# Patient Record
Sex: Female | Born: 1960
Health system: Southern US, Community
[De-identification: ages and names within clinical notes are randomized; demographics above are authoritative.]

## PROBLEM LIST (undated history)

## (undated) DIAGNOSIS — F32A Depression, unspecified: Secondary | ICD-10-CM

## (undated) DIAGNOSIS — M549 Dorsalgia, unspecified: Secondary | ICD-10-CM

## (undated) DIAGNOSIS — F329 Major depressive disorder, single episode, unspecified: Secondary | ICD-10-CM

## (undated) DIAGNOSIS — K219 Gastro-esophageal reflux disease without esophagitis: Secondary | ICD-10-CM

## (undated) DIAGNOSIS — J449 Chronic obstructive pulmonary disease, unspecified: Secondary | ICD-10-CM

## (undated) HISTORY — PX: LYMPH NODE BIOPSY: SHX201

## (undated) HISTORY — PX: SPINE SURGERY: SHX786

## (undated) HISTORY — DX: Dorsalgia, unspecified: M54.9

## (undated) HISTORY — DX: Chronic obstructive pulmonary disease, unspecified: J44.9

## (undated) HISTORY — DX: Major depressive disorder, single episode, unspecified: F32.9

## (undated) HISTORY — PX: TONSILLECTOMY: SUR1361

## (undated) HISTORY — DX: Gastro-esophageal reflux disease without esophagitis: K21.9

## (undated) HISTORY — DX: Depression, unspecified: F32.A

---

## 2000-08-19 HISTORY — PX: CHEST TUBE INSERTION: SHX231

## 2000-08-19 HISTORY — PX: LUNG SURGERY: SHX703

## 2011-12-05 DIAGNOSIS — F329 Major depressive disorder, single episode, unspecified: Secondary | ICD-10-CM | POA: Diagnosis not present

## 2011-12-05 DIAGNOSIS — R351 Nocturia: Secondary | ICD-10-CM | POA: Diagnosis not present

## 2011-12-05 DIAGNOSIS — F3289 Other specified depressive episodes: Secondary | ICD-10-CM | POA: Diagnosis not present

## 2011-12-05 DIAGNOSIS — Z79899 Other long term (current) drug therapy: Secondary | ICD-10-CM | POA: Diagnosis not present

## 2011-12-05 DIAGNOSIS — J441 Chronic obstructive pulmonary disease with (acute) exacerbation: Secondary | ICD-10-CM | POA: Diagnosis not present

## 2011-12-05 DIAGNOSIS — J329 Chronic sinusitis, unspecified: Secondary | ICD-10-CM | POA: Diagnosis not present

## 2011-12-05 DIAGNOSIS — M549 Dorsalgia, unspecified: Secondary | ICD-10-CM | POA: Diagnosis not present

## 2011-12-07 DIAGNOSIS — J449 Chronic obstructive pulmonary disease, unspecified: Secondary | ICD-10-CM | POA: Diagnosis not present

## 2011-12-22 DIAGNOSIS — G4733 Obstructive sleep apnea (adult) (pediatric): Secondary | ICD-10-CM | POA: Diagnosis not present

## 2012-03-19 DIAGNOSIS — M79609 Pain in unspecified limb: Secondary | ICD-10-CM | POA: Diagnosis not present

## 2012-03-19 DIAGNOSIS — G8929 Other chronic pain: Secondary | ICD-10-CM | POA: Diagnosis not present

## 2012-03-19 DIAGNOSIS — G894 Chronic pain syndrome: Secondary | ICD-10-CM | POA: Diagnosis not present

## 2012-03-19 DIAGNOSIS — J449 Chronic obstructive pulmonary disease, unspecified: Secondary | ICD-10-CM | POA: Diagnosis not present

## 2012-03-19 DIAGNOSIS — F3289 Other specified depressive episodes: Secondary | ICD-10-CM | POA: Diagnosis not present

## 2012-03-19 DIAGNOSIS — Z79899 Other long term (current) drug therapy: Secondary | ICD-10-CM | POA: Diagnosis not present

## 2012-03-19 DIAGNOSIS — J4489 Other specified chronic obstructive pulmonary disease: Secondary | ICD-10-CM | POA: Diagnosis not present

## 2012-03-19 DIAGNOSIS — F329 Major depressive disorder, single episode, unspecified: Secondary | ICD-10-CM | POA: Diagnosis not present

## 2012-03-24 DIAGNOSIS — J449 Chronic obstructive pulmonary disease, unspecified: Secondary | ICD-10-CM | POA: Diagnosis not present

## 2012-09-08 DIAGNOSIS — J449 Chronic obstructive pulmonary disease, unspecified: Secondary | ICD-10-CM | POA: Diagnosis not present

## 2012-09-08 DIAGNOSIS — K219 Gastro-esophageal reflux disease without esophagitis: Secondary | ICD-10-CM | POA: Diagnosis not present

## 2012-09-08 DIAGNOSIS — F329 Major depressive disorder, single episode, unspecified: Secondary | ICD-10-CM | POA: Diagnosis not present

## 2012-09-08 DIAGNOSIS — M549 Dorsalgia, unspecified: Secondary | ICD-10-CM | POA: Diagnosis not present

## 2012-10-15 DIAGNOSIS — F17200 Nicotine dependence, unspecified, uncomplicated: Secondary | ICD-10-CM | POA: Diagnosis not present

## 2012-10-15 DIAGNOSIS — J309 Allergic rhinitis, unspecified: Secondary | ICD-10-CM | POA: Diagnosis not present

## 2012-10-15 DIAGNOSIS — J449 Chronic obstructive pulmonary disease, unspecified: Secondary | ICD-10-CM | POA: Diagnosis not present

## 2012-10-15 DIAGNOSIS — Z760 Encounter for issue of repeat prescription: Secondary | ICD-10-CM | POA: Diagnosis not present

## 2012-11-21 DIAGNOSIS — Z79899 Other long term (current) drug therapy: Secondary | ICD-10-CM | POA: Diagnosis not present

## 2012-11-21 DIAGNOSIS — R509 Fever, unspecified: Secondary | ICD-10-CM | POA: Diagnosis not present

## 2012-11-21 DIAGNOSIS — K644 Residual hemorrhoidal skin tags: Secondary | ICD-10-CM | POA: Diagnosis not present

## 2012-11-21 DIAGNOSIS — K5289 Other specified noninfective gastroenteritis and colitis: Secondary | ICD-10-CM | POA: Diagnosis not present

## 2012-11-21 DIAGNOSIS — K648 Other hemorrhoids: Secondary | ICD-10-CM | POA: Diagnosis not present

## 2012-11-21 DIAGNOSIS — R109 Unspecified abdominal pain: Secondary | ICD-10-CM | POA: Diagnosis not present

## 2012-11-21 DIAGNOSIS — R1032 Left lower quadrant pain: Secondary | ICD-10-CM | POA: Diagnosis not present

## 2012-11-21 DIAGNOSIS — E860 Dehydration: Secondary | ICD-10-CM | POA: Diagnosis not present

## 2012-11-26 DIAGNOSIS — F17200 Nicotine dependence, unspecified, uncomplicated: Secondary | ICD-10-CM | POA: Diagnosis not present

## 2012-11-26 DIAGNOSIS — K5289 Other specified noninfective gastroenteritis and colitis: Secondary | ICD-10-CM | POA: Diagnosis not present

## 2012-11-26 DIAGNOSIS — Z1331 Encounter for screening for depression: Secondary | ICD-10-CM | POA: Diagnosis not present

## 2013-01-27 DIAGNOSIS — F17200 Nicotine dependence, unspecified, uncomplicated: Secondary | ICD-10-CM | POA: Diagnosis not present

## 2013-01-27 DIAGNOSIS — K140 Glossitis: Secondary | ICD-10-CM | POA: Diagnosis not present

## 2013-01-27 DIAGNOSIS — G8929 Other chronic pain: Secondary | ICD-10-CM | POA: Diagnosis not present

## 2013-01-27 DIAGNOSIS — M549 Dorsalgia, unspecified: Secondary | ICD-10-CM | POA: Diagnosis not present

## 2013-03-02 DIAGNOSIS — IMO0001 Reserved for inherently not codable concepts without codable children: Secondary | ICD-10-CM | POA: Diagnosis not present

## 2013-03-02 DIAGNOSIS — M5137 Other intervertebral disc degeneration, lumbosacral region: Secondary | ICD-10-CM | POA: Diagnosis not present

## 2013-03-02 DIAGNOSIS — IMO0002 Reserved for concepts with insufficient information to code with codable children: Secondary | ICD-10-CM | POA: Diagnosis not present

## 2013-03-02 DIAGNOSIS — M961 Postlaminectomy syndrome, not elsewhere classified: Secondary | ICD-10-CM | POA: Diagnosis not present

## 2013-03-04 DIAGNOSIS — M5137 Other intervertebral disc degeneration, lumbosacral region: Secondary | ICD-10-CM | POA: Diagnosis not present

## 2013-03-04 DIAGNOSIS — Z79899 Other long term (current) drug therapy: Secondary | ICD-10-CM | POA: Diagnosis not present

## 2013-03-16 DIAGNOSIS — M5137 Other intervertebral disc degeneration, lumbosacral region: Secondary | ICD-10-CM | POA: Diagnosis not present

## 2013-03-16 DIAGNOSIS — IMO0002 Reserved for concepts with insufficient information to code with codable children: Secondary | ICD-10-CM | POA: Diagnosis not present

## 2013-03-16 DIAGNOSIS — T887XXA Unspecified adverse effect of drug or medicament, initial encounter: Secondary | ICD-10-CM | POA: Diagnosis not present

## 2013-03-16 DIAGNOSIS — M961 Postlaminectomy syndrome, not elsewhere classified: Secondary | ICD-10-CM | POA: Diagnosis not present

## 2013-03-16 DIAGNOSIS — IMO0001 Reserved for inherently not codable concepts without codable children: Secondary | ICD-10-CM | POA: Diagnosis not present

## 2013-04-14 DIAGNOSIS — G8929 Other chronic pain: Secondary | ICD-10-CM | POA: Diagnosis not present

## 2013-04-14 DIAGNOSIS — M545 Low back pain: Secondary | ICD-10-CM | POA: Diagnosis not present

## 2013-05-12 DIAGNOSIS — M5137 Other intervertebral disc degeneration, lumbosacral region: Secondary | ICD-10-CM | POA: Diagnosis not present

## 2013-05-12 DIAGNOSIS — G8929 Other chronic pain: Secondary | ICD-10-CM | POA: Diagnosis not present

## 2013-05-12 DIAGNOSIS — M961 Postlaminectomy syndrome, not elsewhere classified: Secondary | ICD-10-CM | POA: Diagnosis not present

## 2013-06-10 DIAGNOSIS — M51379 Other intervertebral disc degeneration, lumbosacral region without mention of lumbar back pain or lower extremity pain: Secondary | ICD-10-CM | POA: Diagnosis not present

## 2013-06-10 DIAGNOSIS — M5137 Other intervertebral disc degeneration, lumbosacral region: Secondary | ICD-10-CM | POA: Diagnosis not present

## 2013-06-10 DIAGNOSIS — Z79899 Other long term (current) drug therapy: Secondary | ICD-10-CM | POA: Diagnosis not present

## 2013-07-05 DIAGNOSIS — Z79899 Other long term (current) drug therapy: Secondary | ICD-10-CM | POA: Diagnosis not present

## 2013-08-05 DIAGNOSIS — M5137 Other intervertebral disc degeneration, lumbosacral region: Secondary | ICD-10-CM | POA: Diagnosis not present

## 2013-08-24 DIAGNOSIS — F17200 Nicotine dependence, unspecified, uncomplicated: Secondary | ICD-10-CM | POA: Diagnosis not present

## 2013-08-24 DIAGNOSIS — J441 Chronic obstructive pulmonary disease with (acute) exacerbation: Secondary | ICD-10-CM | POA: Diagnosis not present

## 2013-08-24 DIAGNOSIS — Z9981 Dependence on supplemental oxygen: Secondary | ICD-10-CM | POA: Diagnosis not present

## 2013-08-24 DIAGNOSIS — J40 Bronchitis, not specified as acute or chronic: Secondary | ICD-10-CM | POA: Diagnosis not present

## 2013-08-24 DIAGNOSIS — I517 Cardiomegaly: Secondary | ICD-10-CM | POA: Diagnosis not present

## 2013-09-20 ENCOUNTER — Ambulatory Visit (INDEPENDENT_AMBULATORY_CARE_PROVIDER_SITE_OTHER): Payer: Medicare Other | Admitting: Family Medicine

## 2013-09-20 ENCOUNTER — Encounter: Payer: Self-pay | Admitting: Family Medicine

## 2013-09-20 ENCOUNTER — Encounter (INDEPENDENT_AMBULATORY_CARE_PROVIDER_SITE_OTHER): Payer: Self-pay

## 2013-09-20 VITALS — BP 103/65 | HR 90 | Temp 97.4°F

## 2013-09-20 DIAGNOSIS — F329 Major depressive disorder, single episode, unspecified: Secondary | ICD-10-CM | POA: Diagnosis not present

## 2013-09-20 DIAGNOSIS — G894 Chronic pain syndrome: Secondary | ICD-10-CM | POA: Diagnosis not present

## 2013-09-20 DIAGNOSIS — F3289 Other specified depressive episodes: Secondary | ICD-10-CM

## 2013-09-20 DIAGNOSIS — J449 Chronic obstructive pulmonary disease, unspecified: Secondary | ICD-10-CM

## 2013-09-20 DIAGNOSIS — K219 Gastro-esophageal reflux disease without esophagitis: Secondary | ICD-10-CM

## 2013-09-20 DIAGNOSIS — F32A Depression, unspecified: Secondary | ICD-10-CM

## 2013-09-20 MED ORDER — TIOTROPIUM BROMIDE MONOHYDRATE 18 MCG IN CAPS: 18.0000 ug | ORAL_CAPSULE | Freq: Every day | RESPIRATORY_TRACT | Status: DC

## 2013-09-20 MED ORDER — CITALOPRAM HYDROBROMIDE 40 MG PO TABS: 40.0000 mg | ORAL_TABLET | Freq: Every day | ORAL | Status: DC

## 2013-09-20 MED ORDER — OMEPRAZOLE 20 MG PO CPDR: 20.0000 mg | DELAYED_RELEASE_CAPSULE | Freq: Every day | ORAL | Status: DC

## 2013-09-20 MED ORDER — CETIRIZINE HCL 10 MG PO TABS: 10.0000 mg | ORAL_TABLET | Freq: Every day | ORAL | Status: DC

## 2013-09-20 NOTE — Progress Notes (Signed)
   Subjective:    Patient ID: Shanan Fitzpatrick, female    DOB: Sep 11, 1960, 53 y.o.   MRN: [id number]  HPI This 53 y.o. female presents for evaluation of new patient visit.  She is in wheelchair and C/o recent fall and severe back pain and weakness in her lower extremities.  She is in Wheelchair and states she is in so severe pain she could not walk into the clinic.  She Was seeing a pain doctor in 12/14 for her chronic pain and she quit  Going.  According to  Her CSR she was on oxycodone 5mg , fentynl patch, and ultram for pain which she states she Has been out of since she moved to be with family in [place].  She is accompanied by a family Member who states she cannot afford to take her to her pain doctor in [place].  She was seeing A doctor Rosann Auerbach.  She has hx of COPD and GERD.   Review of Systems C/o severe back pain No chest pain, SOB, HA, dizziness, vision change, N/V, diarrhea, constipation, dysuria, urinary urgency or frequency, myalgias, arthralgias or rash.     Objective:   Physical Exam  Vital signs noted  Chronically ill cachetic appearing female who appears older that stated age.  HEENT - Head atraumatic Normocephalic                Eyes - PERRLA, Conjuctiva - clear Sclera- Clear EOMI                Ears - EAC's Wnl TM's Wnl Gross Hearing WNL                Throat - oropharanx wnl Respiratory - Lungs CTA and diminished Cardiac - RRR S1 and S2 without murmur GI - Abdomen soft Nontender and bowel sounds active x 4 Extremities - weak lower extremities Neuro - Unstable gait, and unable to walk       Assessment & Plan:  Chronic pain syndrome - Plan: Ambulatory referral to Pain Clinic,  Explained that I cannot do pain Management and do not feel comfortable in tx her back pain and in fact would recommend she go to the ED for xrays and pain meds to get her severe pain under control.  She wants a pain provider Closer to where she is living in [place] and doesn't  want to see Dr. Rosann Auerbach anymore for her pain.  COPD (chronic obstructive pulmonary disease) - Plan: tiotropium (SPIRIVA) 18 MCG inhalation capsule, cetirizine (ZYRTEC) 10 MG tablet  GERD (gastroesophageal reflux disease) - Plan: omeprazole (PRILOSEC) 20 MG capsule  Depression - Plan: citalopram (CELEXA) 40 MG tablet  Lysbeth Penner FNP

## 2013-10-11 ENCOUNTER — Telehealth: Payer: Self-pay | Admitting: Family Medicine

## 2013-12-20 ENCOUNTER — Ambulatory Visit (INDEPENDENT_AMBULATORY_CARE_PROVIDER_SITE_OTHER): Payer: Medicare Other

## 2013-12-20 ENCOUNTER — Encounter: Payer: Self-pay | Admitting: Family Medicine

## 2013-12-20 ENCOUNTER — Ambulatory Visit (INDEPENDENT_AMBULATORY_CARE_PROVIDER_SITE_OTHER): Payer: Medicare Other | Admitting: Family Medicine

## 2013-12-20 VITALS — BP 101/68 | HR 86 | Temp 98.6°F | Ht 62.0 in | Wt 120.0 lb

## 2013-12-20 DIAGNOSIS — R079 Chest pain, unspecified: Secondary | ICD-10-CM

## 2013-12-20 DIAGNOSIS — Z139 Encounter for screening, unspecified: Secondary | ICD-10-CM | POA: Diagnosis not present

## 2013-12-20 DIAGNOSIS — R5381 Other malaise: Secondary | ICD-10-CM

## 2013-12-20 DIAGNOSIS — R0602 Shortness of breath: Secondary | ICD-10-CM

## 2013-12-20 DIAGNOSIS — J209 Acute bronchitis, unspecified: Secondary | ICD-10-CM

## 2013-12-20 DIAGNOSIS — F17200 Nicotine dependence, unspecified, uncomplicated: Secondary | ICD-10-CM

## 2013-12-20 DIAGNOSIS — M6740 Ganglion, unspecified site: Secondary | ICD-10-CM

## 2013-12-20 DIAGNOSIS — Z720 Tobacco use: Secondary | ICD-10-CM

## 2013-12-20 DIAGNOSIS — R5383 Other fatigue: Secondary | ICD-10-CM | POA: Diagnosis not present

## 2013-12-20 DIAGNOSIS — M549 Dorsalgia, unspecified: Secondary | ICD-10-CM

## 2013-12-20 LAB — POCT CBC
Granulocyte percent: 77.7 %G (ref 37–80)
HCT, POC: 42.6 % (ref 37.7–47.9)
Hemoglobin: 13.2 g/dL (ref 12.2–16.2)
Lymph, poc: 2 (ref 0.6–3.4)
MCH, POC: 28 pg (ref 27–31.2)
MCHC: 30.9 g/dL — AB (ref 31.8–35.4)
MCV: 90.5 fL (ref 80–97)
MPV: 7.7 fL (ref 0–99.8)
POC Granulocyte: 8.1 — AB (ref 2–6.9)
POC LYMPH PERCENT: 19.7 %L (ref 10–50)
Platelet Count, POC: 344 10*3/uL (ref 142–424)
RBC: 4.7 M/uL (ref 4.04–5.48)
RDW, POC: 17.6 %
WBC: 10.4 10*3/uL — AB (ref 4.6–10.2)

## 2013-12-20 MED ORDER — AMOXICILLIN 875 MG PO TABS: 875.0000 mg | ORAL_TABLET | Freq: Two times a day (BID) | ORAL | Status: DC

## 2013-12-20 MED ORDER — METHYLPREDNISOLONE (PAK) 4 MG PO TABS: ORAL_TABLET | ORAL | Status: DC

## 2013-12-20 MED ORDER — TRAMADOL HCL 50 MG PO TABS: 100.0000 mg | ORAL_TABLET | Freq: Four times a day (QID) | ORAL | Status: DC | PRN

## 2013-12-20 NOTE — Progress Notes (Signed)
   Subjective:    Patient ID: Kristy Moore, female    DOB: [birth year], 53 y.o.   MRN: [id number]  HPI  This 53 y.o. female presents for evaluation of back pain, chest pain, and right wrist pain, and her sister Accompanies her and states she cannot walk very well.  She is living with her sister.  She had a referral to pain but she states it didn't get done.  She is not able to care for herself according to her sister.  She states she has been in so much pain she has been on the floor and her sister found her and she could not walk.  She has a mass in her right wrist that is causing pain.  She has hx of DDD of the LS spine and has had surgery.  She was seeing pain management in [place].  She moved down to live with her sister in 2/15.  She has a mass in her right wrist.  She has been a smoker for a long time.  She is coughing and gets SOB.  Review of Systems C/o back pain, SOB, and right wrist mass.   No  HA, dizziness, vision change, N/V, diarrhea, constipation, dysuria, urinary urgency or frequency, myalgias, arthralgias or rash.  Objective:   Physical Exam  Vital signs noted  Chronically ill appearing female in NAD.  HEENT - Head atraumatic Normocephalic                Eyes - PERRLA, Conjuctiva - clear Sclera- Clear EOMI                Ears - EAC's Wnl TM's Wnl Gross Hearing WNL                Throat - oropharanx wnl Respiratory - Lungs CTA bilateral Cardiac - RRR S1 and S2 without murmur GI - Abdomen soft Nontender and bowel sounds active x 4 Extremities - No edema. Neuro - Grossly intact. MS - right wrist with ganglion cyst,  TTP LS spine    EKG NSR with early repolarization and no acute st-t changes CXR - Hyperinflation no infiltrates Prelimnary reading by Iverson Alamin Assessment & Plan:  Chest pain - Plan: EKG 12-Lead, DG Chest 2 View, Ambulatory referral to Cardiology  SOB (shortness of breath) - Plan: EKG 12-Lead, DG Chest 2 View, POCT CBC, CMP14+EGFR  Fatigue -  Plan: Thyroid Panel With TSH  Ganglion cyst - Plan: Ambulatory referral to Orthopedic Surgery  Acute bronchitis - Plan: amoxicillin (AMOXIL) 875 MG tablet, methylPREDNIsolone (MEDROL DOSPACK) 4 MG tablet  Tobacco abuse - discussed at length the need to quit smoking  Bronchitis - Amoxicillin 866m one po bid x 10 days #20, Medrol dose pack as directed.  DDD and Back pain - Walker rx'd, referral to pain management checked and is still pending. Tramadol 528m2 po tid prn #90  Follow up in 2 weeks  WiLysbeth PennerNP

## 2013-12-21 ENCOUNTER — Other Ambulatory Visit: Payer: Self-pay | Admitting: Family Medicine

## 2013-12-21 LAB — CMP14+EGFR
ALT: 15 IU/L (ref 0–32)
AST: 25 IU/L (ref 0–40)
Albumin/Globulin Ratio: 2.2 (ref 1.1–2.5)
Albumin: 4.3 g/dL (ref 3.5–5.5)
Alkaline Phosphatase: 64 IU/L (ref 39–117)
BUN/Creatinine Ratio: 25 — ABNORMAL HIGH (ref 9–23)
BUN: 15 mg/dL (ref 6–24)
CO2: 21 mmol/L (ref 18–29)
Calcium: 9.3 mg/dL (ref 8.7–10.2)
Chloride: 102 mmol/L (ref 97–108)
Creatinine, Ser: 0.59 mg/dL (ref 0.57–1.00)
GFR calc Af Amer: 120 mL/min/{1.73_m2} (ref >59)
GFR calc non Af Amer: 104 mL/min/{1.73_m2} (ref >59)
Globulin, Total: 2 g/dL (ref 1.5–4.5)
Glucose: 66 mg/dL (ref 65–99)
Potassium: 4.4 mmol/L (ref 3.5–5.2)
Sodium: 142 mmol/L (ref 134–144)
Total Bilirubin: <0.2 mg/dL (ref 0.0–1.2)
Total Protein: 6.3 g/dL (ref 6.0–8.5)

## 2013-12-21 LAB — THYROID PANEL WITH TSH
Free Thyroxine Index: 1.4 (ref 1.2–4.9)
T3 Uptake Ratio: 26 % (ref 24–39)
T4, Total: 5.4 ug/dL (ref 4.5–12.0)
TSH: 0.445 u[IU]/mL — ABNORMAL LOW (ref 0.450–4.500)

## 2013-12-21 MED ORDER — LEVOFLOXACIN 500 MG PO TABS: 500.0000 mg | ORAL_TABLET | Freq: Every day | ORAL | Status: DC

## 2013-12-23 ENCOUNTER — Other Ambulatory Visit: Payer: Self-pay | Admitting: Family Medicine

## 2013-12-27 ENCOUNTER — Telehealth: Payer: Self-pay | Admitting: Family Medicine

## 2013-12-27 NOTE — Telephone Encounter (Signed)
Pt notified of xray results  Also informed Rx was sent into [facility] Will follow up

## 2013-12-27 NOTE — Telephone Encounter (Signed)
Message copied by Waverly Ferrari on Mon Dec 27, 2013 11:13 AM ------      Message from: Lysbeth Penner      Created: Thu Dec 23, 2013 11:50 AM       WBC elevated due to pneumonia, TSH low and would follow this and repeat thyroid panel in next few months ------

## 2014-01-06 ENCOUNTER — Ambulatory Visit (INDEPENDENT_AMBULATORY_CARE_PROVIDER_SITE_OTHER): Payer: Medicare Other | Admitting: Family Medicine

## 2014-01-06 ENCOUNTER — Encounter: Payer: Self-pay | Admitting: Family Medicine

## 2014-01-06 ENCOUNTER — Ambulatory Visit (INDEPENDENT_AMBULATORY_CARE_PROVIDER_SITE_OTHER): Payer: Medicare Other

## 2014-01-06 VITALS — BP 123/77 | HR 94 | Temp 98.2°F | Ht 62.0 in | Wt 118.0 lb

## 2014-01-06 DIAGNOSIS — J449 Chronic obstructive pulmonary disease, unspecified: Secondary | ICD-10-CM | POA: Diagnosis not present

## 2014-01-06 DIAGNOSIS — M549 Dorsalgia, unspecified: Secondary | ICD-10-CM | POA: Diagnosis not present

## 2014-01-06 DIAGNOSIS — J189 Pneumonia, unspecified organism: Secondary | ICD-10-CM

## 2014-01-06 DIAGNOSIS — R7989 Other specified abnormal findings of blood chemistry: Secondary | ICD-10-CM

## 2014-01-06 DIAGNOSIS — R946 Abnormal results of thyroid function studies: Secondary | ICD-10-CM | POA: Diagnosis not present

## 2014-01-06 LAB — POCT CBC
Granulocyte percent: 65.3 %G (ref 37–80)
HCT, POC: 44.3 % (ref 37.7–47.9)
Hemoglobin: 13.8 g/dL (ref 12.2–16.2)
Lymph, poc: 2.4 (ref 0.6–3.4)
MCH, POC: 28.1 pg (ref 27–31.2)
MCHC: 31.2 g/dL — AB (ref 31.8–35.4)
MCV: 90.1 fL (ref 80–97)
MPV: 7.3 fL (ref 0–99.8)
POC Granulocyte: 5.3 (ref 2–6.9)
POC LYMPH PERCENT: 29.8 %L (ref 10–50)
Platelet Count, POC: 418 10*3/uL (ref 142–424)
RBC: 4.9 M/uL (ref 4.04–5.48)
RDW, POC: 16.5 %
WBC: 8.1 10*3/uL (ref 4.6–10.2)

## 2014-01-06 MED ORDER — IPRATROPIUM BROMIDE 0.02 % IN SOLN: 0.5000 mg | Freq: Four times a day (QID) | RESPIRATORY_TRACT | Status: DC

## 2014-01-06 MED ORDER — METHYLPREDNISOLONE ACETATE 80 MG/ML IJ SUSP
80.0000 mg | Freq: Once | INTRAMUSCULAR | Status: AC
  Administered 2014-01-06: 80 mg via INTRAMUSCULAR

## 2014-01-06 MED ORDER — PREDNISONE 10 MG PO TABS: ORAL_TABLET | ORAL | Status: DC

## 2014-01-06 MED ORDER — ALBUTEROL SULFATE (2.5 MG/3ML) 0.083% IN NEBU: INHALATION_SOLUTION | RESPIRATORY_TRACT | Status: DC

## 2014-01-06 MED ORDER — TRAMADOL HCL 50 MG PO TABS: 100.0000 mg | ORAL_TABLET | Freq: Four times a day (QID) | ORAL | Status: DC | PRN

## 2014-01-06 MED ORDER — LEVALBUTEROL HCL 1.25 MG/0.5ML IN NEBU
1.2500 mg | INHALATION_SOLUTION | Freq: Once | RESPIRATORY_TRACT | Status: AC
  Administered 2014-01-06: 1.25 mg via RESPIRATORY_TRACT

## 2014-01-06 NOTE — Progress Notes (Signed)
   Subjective:    Patient ID: Canesha Tesfaye, female    DOB: 06-01-1961, 53 y.o.   MRN: [id number]  HPI This 53 y.o. female presents for evaluation of follow up on pneumonia.  She has right middle lobe pneumonia and has been on levaquin.   She feels sob with exertion and she does still have some Fever. She states she does feel better.  She is not in as much pain and she is able to walk better after taking the tramadol.   Review of Systems C/o SOB, cough, uri sx's   No chest pain, SOB, HA, dizziness, vision change, N/V, diarrhea, constipation, dysuria, urinary urgency or frequency, myalgias, arthralgias or rash.  Objective:   Physical Exam  Vital signs noted  Chronically ill appearing female in NAD.  HEENT - Head atraumatic Normocephalic                Eyes - PERRLA, Conjuctiva - clear Sclera- Clear EOMI                Ears - EAC's Wnl TM's Wnl Gross Hearing WNL                Throat - oropharanx wnl Respiratory - Lungs expiratory wheezes scattered Cardiac - RRR S1 and S2 without murmur GI - Abdomen soft Nontender and bowel sounds active x 4 Extremities - No edema. Neuro - Grossly intact.  Results for orders placed in visit on 01/06/14  POCT CBC      Result Value Ref Range   WBC 8.1  4.6 - 10.2 K/uL   Lymph, poc 2.4  0.6 - 3.4   POC LYMPH PERCENT 29.8  10 - 50 %L   POC Granulocyte 5.3  2 - 6.9   Granulocyte percent 65.3  37 - 80 %G   RBC 4.9  4.04 - 5.48 M/uL   Hemoglobin 13.8  12.2 - 16.2 g/dL   HCT, POC 44.3  37.7 - 47.9 %   MCV 90.1  80 - 97 fL   MCH, POC 28.1  27 - 31.2 pg   MCHC 31.2 (*) 31.8 - 35.4 g/dL   RDW, POC 16.5     Platelet Count, POC 418.0  142 - 424 K/uL   MPV 7.3  0 - 99.8 fL     CXR - Pneumonia resolving right chest Prelimnary reading by Gwyndolyn Saxon Cecilio Ohlrich,FNP Assessment & Plan:  CAP (community acquired pneumonia) - Plan: POCT CBC, BMP8+EGFR, DG Chest 2 View, levalbuterol (XOPENEX) nebulizer solution 1.25 mg, ipratropium (ATROVENT) 0.02 % nebulizer  solution, albuterol (PROVENTIL) (2.5 MG/3ML) 0.083% nebulizer solution, predniSONE (DELTASONE) 10 MG tablet, methylPREDNISolone acetate (DEPO-MEDROL) injection 80 mg  Abnormal TSH - Plan: Thyroid Panel With TSH, predniSONE (DELTASONE) 10 MG tablet  COPD (chronic obstructive pulmonary disease) - Plan: levalbuterol (XOPENEX) nebulizer solution 1.25 mg, ipratropium (ATROVENT) 0.02 % nebulizer solution, albuterol (PROVENTIL) (2.5 MG/3ML) 0.083% nebulizer solution, predniSONE (DELTASONE) 10 MG tablet  Back pain - Plan: traMADol (ULTRAM) 50 MG tablet  Lysbeth Penner FNP

## 2014-01-07 LAB — BMP8+EGFR
BUN/Creatinine Ratio: 17 (ref 9–23)
BUN: 11 mg/dL (ref 6–24)
CO2: 22 mmol/L (ref 18–29)
Calcium: 9.5 mg/dL (ref 8.7–10.2)
Chloride: 102 mmol/L (ref 97–108)
Creatinine, Ser: 0.65 mg/dL (ref 0.57–1.00)
GFR calc Af Amer: 116 mL/min/{1.73_m2} (ref >59)
GFR calc non Af Amer: 101 mL/min/{1.73_m2} (ref >59)
Glucose: 80 mg/dL (ref 65–99)
Potassium: 3.8 mmol/L (ref 3.5–5.2)
Sodium: 142 mmol/L (ref 134–144)

## 2014-01-07 LAB — THYROID PANEL WITH TSH
Free Thyroxine Index: 1.9 (ref 1.2–4.9)
T3 Uptake Ratio: 28 % (ref 24–39)
T4, Total: 6.9 ug/dL (ref 4.5–12.0)
TSH: 1.07 u[IU]/mL (ref 0.450–4.500)

## 2014-01-11 ENCOUNTER — Other Ambulatory Visit: Payer: Self-pay | Admitting: Family Medicine

## 2014-01-26 ENCOUNTER — Encounter: Payer: Self-pay | Admitting: Family Medicine

## 2014-01-26 ENCOUNTER — Ambulatory Visit (INDEPENDENT_AMBULATORY_CARE_PROVIDER_SITE_OTHER): Payer: Medicare Other | Admitting: Family Medicine

## 2014-01-26 VITALS — BP 112/74 | HR 83 | Temp 97.8°F | Ht 62.0 in | Wt 120.0 lb

## 2014-01-26 DIAGNOSIS — M549 Dorsalgia, unspecified: Secondary | ICD-10-CM | POA: Diagnosis not present

## 2014-01-26 DIAGNOSIS — J449 Chronic obstructive pulmonary disease, unspecified: Secondary | ICD-10-CM | POA: Diagnosis not present

## 2014-01-26 MED ORDER — AMITRIPTYLINE HCL 25 MG PO TABS: 25.0000 mg | ORAL_TABLET | Freq: Every day | ORAL | Status: DC

## 2014-01-26 MED ORDER — FLUTICASONE-SALMETEROL 250-50 MCG/DOSE IN AEPB: 1.0000 | INHALATION_SPRAY | Freq: Two times a day (BID) | RESPIRATORY_TRACT | Status: DC

## 2014-01-26 MED ORDER — ALBUTEROL SULFATE HFA 108 (90 BASE) MCG/ACT IN AERS: 2.0000 | INHALATION_SPRAY | Freq: Four times a day (QID) | RESPIRATORY_TRACT | Status: DC | PRN

## 2014-01-26 MED ORDER — TRAMADOL HCL 50 MG PO TABS: 100.0000 mg | ORAL_TABLET | Freq: Four times a day (QID) | ORAL | Status: DC | PRN

## 2014-01-26 NOTE — Progress Notes (Signed)
   Subjective:    Patient ID: Kristy Moore, female    DOB: 08-24-1960, 53 y.o.   MRN: [id number]  HPI  This 53 y.o. female presents for evaluation of back pain and needs refills.  She has copd and needs a refill on SABA albuterol MDI.  Review of Systems    No chest pain, SOB, HA, dizziness, vision change, N/V, diarrhea, constipation, dysuria, urinary urgency or frequency, myalgias, arthralgias or rash.  Objective:   Physical Exam  Vital signs noted  Well developed well nourished female.  HEENT - Head atraumatic Normocephalic                Eyes - PERRLA, Conjuctiva - clear Sclera- Clear EOMI                Ears - EAC's Wnl TM's Wnl Gross Hearing WNL                Throat - oropharanx wnl Respiratory - Lungs expiratory wheezes bilateral Cardiac - RRR S1 and S2 without murmur GI - Abdomen soft Nontender and bowel sounds active x 4 Extremities - No edema. Neuro - Grossly intact.      Assessment & Plan:  Back pain - Plan: traMADol (ULTRAM) 50 MG tablet, amitriptyline (ELAVIL) 25 MG tablet  COPD (chronic obstructive pulmonary disease) - Plan: albuterol (PROAIR HFA) 108 (90 BASE) MCG/ACT inhaler, Fluticasone-Salmeterol (ADVAIR DISKUS) 250-50 MCG/DOSE AEPB  Kristy Penner FNP

## 2014-02-11 ENCOUNTER — Telehealth: Payer: Self-pay | Admitting: Family Medicine

## 2014-02-12 DIAGNOSIS — Z9981 Dependence on supplemental oxygen: Secondary | ICD-10-CM | POA: Diagnosis not present

## 2014-02-12 DIAGNOSIS — J189 Pneumonia, unspecified organism: Secondary | ICD-10-CM | POA: Diagnosis not present

## 2014-02-12 DIAGNOSIS — I498 Other specified cardiac arrhythmias: Secondary | ICD-10-CM | POA: Diagnosis not present

## 2014-02-12 DIAGNOSIS — Q762 Congenital spondylolisthesis: Secondary | ICD-10-CM | POA: Diagnosis not present

## 2014-02-12 DIAGNOSIS — J449 Chronic obstructive pulmonary disease, unspecified: Secondary | ICD-10-CM | POA: Diagnosis not present

## 2014-02-12 DIAGNOSIS — R5381 Other malaise: Secondary | ICD-10-CM | POA: Diagnosis not present

## 2014-02-12 DIAGNOSIS — R0602 Shortness of breath: Secondary | ICD-10-CM | POA: Diagnosis not present

## 2014-02-12 DIAGNOSIS — F17200 Nicotine dependence, unspecified, uncomplicated: Secondary | ICD-10-CM | POA: Diagnosis not present

## 2014-02-12 DIAGNOSIS — R0609 Other forms of dyspnea: Secondary | ICD-10-CM | POA: Diagnosis not present

## 2014-02-12 DIAGNOSIS — G8929 Other chronic pain: Secondary | ICD-10-CM | POA: Diagnosis not present

## 2014-02-12 DIAGNOSIS — K219 Gastro-esophageal reflux disease without esophagitis: Secondary | ICD-10-CM | POA: Diagnosis not present

## 2014-02-12 DIAGNOSIS — R918 Other nonspecific abnormal finding of lung field: Secondary | ICD-10-CM | POA: Diagnosis not present

## 2014-02-12 DIAGNOSIS — J159 Unspecified bacterial pneumonia: Secondary | ICD-10-CM | POA: Diagnosis not present

## 2014-02-12 DIAGNOSIS — F3289 Other specified depressive episodes: Secondary | ICD-10-CM | POA: Diagnosis not present

## 2014-02-12 DIAGNOSIS — R5383 Other fatigue: Secondary | ICD-10-CM | POA: Diagnosis not present

## 2014-02-12 DIAGNOSIS — F411 Generalized anxiety disorder: Secondary | ICD-10-CM | POA: Diagnosis not present

## 2014-02-12 DIAGNOSIS — J4489 Other specified chronic obstructive pulmonary disease: Secondary | ICD-10-CM | POA: Diagnosis not present

## 2014-02-12 DIAGNOSIS — F4489 Other dissociative and conversion disorders: Secondary | ICD-10-CM | POA: Diagnosis not present

## 2014-02-12 DIAGNOSIS — M549 Dorsalgia, unspecified: Secondary | ICD-10-CM | POA: Diagnosis not present

## 2014-02-12 DIAGNOSIS — F329 Major depressive disorder, single episode, unspecified: Secondary | ICD-10-CM | POA: Diagnosis present

## 2014-02-12 DIAGNOSIS — R1110 Vomiting, unspecified: Secondary | ICD-10-CM | POA: Diagnosis not present

## 2014-02-21 ENCOUNTER — Encounter: Payer: Self-pay | Admitting: Family Medicine

## 2014-02-21 ENCOUNTER — Ambulatory Visit (INDEPENDENT_AMBULATORY_CARE_PROVIDER_SITE_OTHER): Payer: Medicare Other | Admitting: Family Medicine

## 2014-02-21 VITALS — BP 126/85 | HR 84 | Temp 98.7°F | Ht 62.0 in | Wt 120.0 lb

## 2014-02-21 DIAGNOSIS — M545 Low back pain, unspecified: Secondary | ICD-10-CM | POA: Diagnosis not present

## 2014-02-21 DIAGNOSIS — J189 Pneumonia, unspecified organism: Secondary | ICD-10-CM

## 2014-02-21 DIAGNOSIS — J441 Chronic obstructive pulmonary disease with (acute) exacerbation: Secondary | ICD-10-CM | POA: Diagnosis not present

## 2014-02-21 MED ORDER — TRAMADOL HCL 50 MG PO TABS: 100.0000 mg | ORAL_TABLET | Freq: Four times a day (QID) | ORAL | Status: DC | PRN

## 2014-02-21 NOTE — Progress Notes (Signed)
   Subjective:    Patient ID: Kristy Moore, female    DOB: 1961/05/16, 53 y.o.   MRN: [id number]  HPI This 53 y.o. female presents for evaluation of hospital follow up from recent admission  02/12/14 to 02/14/14.  She had left upper lobe pneumonia and was tx'd with IV rocephin and DC'd home on zpak and prednisone taper which she is finishing.  She was having fever and this Has resolved.  She has hx of pneumonia a few months ago.  She has hx of long time tobacco abuse And COPD.  She has chronic pain due to arthritis and needs refills on tramadol.   Review of Systems  C/o arthritis and follow up pneumonia  No chest pain, SOB, HA, dizziness, vision change, N/V, diarrhea, constipation, dysuria, urinary urgency or frequency or rash.     Objective:   Physical Exam Vital signs noted  Well developed well nourished female.  HEENT - Head atraumatic Normocephalic                Eyes - PERRLA, Conjuctiva - clear Sclera- Clear EOMI                Ears - EAC's Wnl TM's Wnl Gross Hearing WNL                Nose - Nares patent                 Throat - oropharanx wnl Respiratory - Lungs CTA bilateral w/o adventitious BS Cardiac - RRR S1 and S2 without murmur GI - Abdomen soft Nontender and bowel sounds active x 4 Extremities - No edema. Neuro - Grossly intact.       Assessment & Plan:  Bilateral low back pain without sciatica - Plan: traMADol (ULTRAM) 50 MG tablet  Back pain at L4-L5 level - Plan: Tramadol rx refilled  CAP - Hospital follow up for pneumonia - This is the second time this year so refer to pulmonary.  She looks good recommend she continue abx's and steroids and follow up prn if sx's return.  COPD - Referral to pulmonary due to chronic SOB and she has been having persistent pneumonia.  Tobacco abuse - Discussed DC tobacco at length.  Lysbeth Penner FNP

## 2014-02-23 ENCOUNTER — Encounter: Payer: Self-pay | Admitting: *Deleted

## 2014-03-02 ENCOUNTER — Institutional Professional Consult (permissible substitution): Payer: Medicare Other | Admitting: Cardiology

## 2014-03-07 ENCOUNTER — Institutional Professional Consult (permissible substitution): Payer: Self-pay | Admitting: Internal Medicine

## 2014-03-11 ENCOUNTER — Institutional Professional Consult (permissible substitution): Payer: Self-pay | Admitting: Internal Medicine

## 2014-03-21 ENCOUNTER — Encounter: Payer: Self-pay | Admitting: Internal Medicine

## 2014-03-21 ENCOUNTER — Ambulatory Visit (INDEPENDENT_AMBULATORY_CARE_PROVIDER_SITE_OTHER): Payer: Medicare Other | Admitting: Internal Medicine

## 2014-03-21 ENCOUNTER — Ambulatory Visit (INDEPENDENT_AMBULATORY_CARE_PROVIDER_SITE_OTHER)
Admission: RE | Admit: 2014-03-21 | Discharge: 2014-03-21 | Disposition: A | Discharge status: Home / Self Care | Payer: Medicare Other | Source: Ambulatory Visit | Attending: Internal Medicine | Admitting: Internal Medicine

## 2014-03-21 VITALS — BP 90/60 | HR 88 | Temp 98.1°F | Ht 63.0 in | Wt 113.0 lb

## 2014-03-21 DIAGNOSIS — F17200 Nicotine dependence, unspecified, uncomplicated: Secondary | ICD-10-CM

## 2014-03-21 DIAGNOSIS — R059 Cough, unspecified: Secondary | ICD-10-CM | POA: Diagnosis not present

## 2014-03-21 DIAGNOSIS — J432 Centrilobular emphysema: Secondary | ICD-10-CM | POA: Insufficient documentation

## 2014-03-21 DIAGNOSIS — J449 Chronic obstructive pulmonary disease, unspecified: Secondary | ICD-10-CM

## 2014-03-21 DIAGNOSIS — J438 Other emphysema: Secondary | ICD-10-CM | POA: Diagnosis not present

## 2014-03-21 DIAGNOSIS — R05 Cough: Secondary | ICD-10-CM | POA: Diagnosis not present

## 2014-03-21 MED ORDER — TIOTROPIUM BROMIDE MONOHYDRATE 18 MCG IN CAPS: 18.0000 ug | ORAL_CAPSULE | Freq: Every day | RESPIRATORY_TRACT | Status: DC

## 2014-03-21 MED ORDER — BUDESONIDE-FORMOTEROL FUMARATE 160-4.5 MCG/ACT IN AERO: INHALATION_SPRAY | RESPIRATORY_TRACT | Status: DC

## 2014-03-21 NOTE — Assessment & Plan Note (Addendum)
DDX of  difficult airways management all start with A and  include Adherence, Ace Inhibitors, Acid Reflux, Active Sinus Disease, Alpha 1 Antitripsin deficiency, Anxiety masquerading as Airways dz,  ABPA,  allergy(esp in young), Aspiration (esp in elderly), Adverse effects of DPI,  Active smokers, plus two Bs  = Bronchiectasis and Beta blocker use..and one C= CHF  Adherence is always the initial "prime suspect" and is a multilayered concern that requires a "trust but verify" approach in every patient - starting with knowing how to use medications, especially inhalers, correctly, keeping up with refills and understanding the fundamental difference between maintenance and prns vs those medications only taken for a very short course and then stopped and not refilled.  The proper method of use, as well as anticipated side effects, of a metered-dose inhaler are discussed and demonstrated to the patient. Improved effectiveness after extensive coaching during this visit to a level of approximately  75% so try symbicort  ? Acid (or non-acid) GERD > always difficult to exclude as up to 75% of pts in some series report no assoc GI/ Heartburn symptoms> rec continue max (24h)  acid suppression and diet restrictions/ reviewed     Active smoking greatest concern > see sep a/p   ? Adverse effects of dpi > try off advair first, then spriva change to respimat next ov if has mastered hfa

## 2014-03-21 NOTE — Assessment & Plan Note (Signed)

## 2014-03-21 NOTE — Patient Instructions (Addendum)
symbicort 160 Take 2 puffs first thing in am and then another 2 puffs about 12 hours later.   Only use your albuterol as a rescue medication to be used if you can't catch your breath by resting or doing a relaxed purse lip breathing pattern.  - The less you use it, the better it will work when you need it. - Ok to use up to 2 puffs  every 4 hours if you must but call for immediate appointment if use goes up over your usual need - Don't leave home without it !!  (think of it like the spare tire for your car)   prilosec / omeprazole Take 30- 60 min before your first and last meals of the day   Stop advair and the smoking and atrovent   Please remember to go to the   x-ray department downstairs for your tests - we will call you with the results when they are available.     Please schedule a follow up office visit in 2 weeks, sooner if needed to Tammy NP with all medications in hand

## 2014-03-21 NOTE — Progress Notes (Signed)
Subjective:    Patient ID: Kristy Moore, female    DOB: May 17, 1961   MRN: [id number]  HPI  44 yowf active smoker doing ok until 2002 collapsed R lung requiring VATs in [place] and did ok   on advair then downhill p back surgery in [facility] around 3546 complicated by osteo removed the metal and did poorly and bad to worse starting early July 2015 > admit [facility] x sev days dx pneumonia and referred 03/21/2014 to pulmonary clinic by [facility].   03/21/2014 1st [facility] office visit/ Kristy Moore / still smoking/ poor hfa   Chief Complaint  Patient presents with  . Pulmonary Consult    Referred per Dr. Stevan Born. Pt c/o cough, CP and dyspnea for the past 3 wks. She states that she gets SOB with "any exertion" and sometimes SOB just at rest.  She states that CP is sharp and dull and "feels like a knife".    w/c bound chronically due to back pain. Hacking cough but minimal mucus and not purulent- saba neb helps more than anything, advair makes her cough worse and not helping her breathing. The areas where she has cp are L> R ant > post but diffuse, have been present for years always brought on by cough, none are new location, all improve on tramadol.   No obvious other patterns in day to day or daytime variabilty or assoc   cp or chest tightness, subjective wheeze or overt sinus  symptoms. No unusual exp hx or h/o childhood pna/ asthma or knowledge of premature birth.  Sleeping ok without nocturnal  or early am exacerbation  of respiratory  c/o's or need for noct saba. Also denies any obvious fluctuation of symptoms with weather or environmental changes or other aggravating or alleviating factors except as outlined above   Current Medications, Allergies, Complete Past Medical History, Past Surgical History, Family History, and Social History were reviewed in [employer] record.            Review of Systems  Constitutional: Positive for unexpected weight change.  Negative for fever and chills.  HENT: Positive for congestion, dental problem and trouble swallowing. Negative for ear pain, nosebleeds, postnasal drip, rhinorrhea, sinus pressure, sneezing, sore throat and voice change.   Eyes: Negative for visual disturbance.  Respiratory: Positive for cough and shortness of breath. Negative for choking.   Cardiovascular: Positive for chest pain. Negative for leg swelling.  Gastrointestinal: Positive for abdominal pain. Negative for vomiting and diarrhea.  Genitourinary: Negative for difficulty urinating.       Acid heartburn  Indigestion  Musculoskeletal: Positive for arthralgias.  Skin: Negative for rash.  Neurological: Negative for tremors, syncope and headaches.  Hematological: Does not bruise/bleed easily.       Objective:   Physical Exam  W/c bound wf >>> state age  Wt Readings from Last 3 Encounters:  03/21/14 113 lb (51.256 kg)  02/21/14 120 lb (54.432 kg)  01/26/14 120 lb (54.432 kg)      HEENT mild turbinate edema.  Oropharynx no thrush or excess pnd or cobblestoning.  No JVD or cervical adenopathy. Mild accessory muscle hypertrophy. Trachea midline, nl thryroid. Chest was hyperinflated by percussion with diminished breath sounds and moderate increased exp time without wheeze. Hoover sign positive at mid inspiration. Regular rate and rhythm without murmur gallop or rub or increase P2 or edema.  Abd: no hsm, nl excursion. Ext warm without cyanosis or clubbing.      CXR  03/21/2014 :  Variable chronic ssatx rml since 12/20/13     Assessment & Plan:

## 2014-03-22 NOTE — Progress Notes (Signed)
Quick Note:  Spoke with pt and notified of results per Dr. Wert. Pt verbalized understanding and denied any questions.  ______ 

## 2014-03-30 ENCOUNTER — Other Ambulatory Visit: Payer: Self-pay | Admitting: Family Medicine

## 2014-03-30 ENCOUNTER — Telehealth: Payer: Self-pay | Admitting: Family Medicine

## 2014-03-30 DIAGNOSIS — M545 Low back pain, unspecified: Secondary | ICD-10-CM

## 2014-03-30 MED ORDER — TRAMADOL HCL 50 MG PO TABS: 100.0000 mg | ORAL_TABLET | Freq: Four times a day (QID) | ORAL | Status: DC | PRN

## 2014-03-30 NOTE — Telephone Encounter (Signed)
Her rx for tramadol is ready for pick up and I cannot rx anymore that 120 and she is going to be referred to pain specialist and referral has been put in.

## 2014-03-31 NOTE — Telephone Encounter (Signed)
Patient aware.

## 2014-04-04 ENCOUNTER — Encounter: Payer: Medicare Other | Admitting: Adult Health

## 2014-04-14 ENCOUNTER — Encounter: Payer: Medicare Other | Admitting: Adult Health

## 2014-04-14 ENCOUNTER — Emergency Department (HOSPITAL_COMMUNITY): Payer: Medicare Other

## 2014-04-14 ENCOUNTER — Encounter (HOSPITAL_COMMUNITY): Payer: Self-pay | Admitting: Emergency Medicine

## 2014-04-14 ENCOUNTER — Inpatient Hospital Stay (HOSPITAL_COMMUNITY)
Admission: EM | Admit: 2014-04-14 | Discharge: 2014-04-17 | DRG: 190 | Disposition: A | Discharge status: Home / Self Care | Payer: Medicare Other | Attending: Internal Medicine | Admitting: Internal Medicine

## 2014-04-14 ENCOUNTER — Encounter: Payer: Self-pay | Admitting: Family Medicine

## 2014-04-14 ENCOUNTER — Ambulatory Visit (INDEPENDENT_AMBULATORY_CARE_PROVIDER_SITE_OTHER): Payer: Medicare Other | Admitting: Family Medicine

## 2014-04-14 VITALS — BP 104/70 | HR 94 | Temp 98.9°F | Ht 63.0 in | Wt 117.0 lb

## 2014-04-14 DIAGNOSIS — J9620 Acute and chronic respiratory failure, unspecified whether with hypoxia or hypercapnia: Secondary | ICD-10-CM | POA: Diagnosis present

## 2014-04-14 DIAGNOSIS — Z825 Family history of asthma and other chronic lower respiratory diseases: Secondary | ICD-10-CM | POA: Diagnosis not present

## 2014-04-14 DIAGNOSIS — J9621 Acute and chronic respiratory failure with hypoxia: Secondary | ICD-10-CM

## 2014-04-14 DIAGNOSIS — F17200 Nicotine dependence, unspecified, uncomplicated: Secondary | ICD-10-CM | POA: Diagnosis present

## 2014-04-14 DIAGNOSIS — Z8249 Family history of ischemic heart disease and other diseases of the circulatory system: Secondary | ICD-10-CM | POA: Diagnosis not present

## 2014-04-14 DIAGNOSIS — J438 Other emphysema: Secondary | ICD-10-CM

## 2014-04-14 DIAGNOSIS — J439 Emphysema, unspecified: Secondary | ICD-10-CM

## 2014-04-14 DIAGNOSIS — J449 Chronic obstructive pulmonary disease, unspecified: Secondary | ICD-10-CM | POA: Insufficient documentation

## 2014-04-14 DIAGNOSIS — R4182 Altered mental status, unspecified: Secondary | ICD-10-CM | POA: Diagnosis not present

## 2014-04-14 DIAGNOSIS — R0602 Shortness of breath: Secondary | ICD-10-CM | POA: Diagnosis not present

## 2014-04-14 DIAGNOSIS — Z9981 Dependence on supplemental oxygen: Secondary | ICD-10-CM | POA: Diagnosis not present

## 2014-04-14 DIAGNOSIS — J441 Chronic obstructive pulmonary disease with (acute) exacerbation: Secondary | ICD-10-CM | POA: Diagnosis not present

## 2014-04-14 DIAGNOSIS — E876 Hypokalemia: Secondary | ICD-10-CM | POA: Diagnosis present

## 2014-04-14 DIAGNOSIS — F29 Unspecified psychosis not due to a substance or known physiological condition: Secondary | ICD-10-CM | POA: Diagnosis not present

## 2014-04-14 DIAGNOSIS — J8410 Pulmonary fibrosis, unspecified: Secondary | ICD-10-CM | POA: Diagnosis not present

## 2014-04-14 LAB — URINALYSIS, ROUTINE W REFLEX MICROSCOPIC
BILIRUBIN URINE: NEGATIVE
Glucose, UA: NEGATIVE mg/dL
Ketones, ur: 15 mg/dL — AB
LEUKOCYTES UA: NEGATIVE
NITRITE: NEGATIVE
PH: 5.5 (ref 5.0–8.0)
UROBILINOGEN UA: 0.2 mg/dL (ref 0.0–1.0)

## 2014-04-14 LAB — BLOOD GAS, ARTERIAL
ACID-BASE EXCESS: 2.2 mmol/L — AB (ref 0.0–2.0)
Bicarbonate: 26.9 mEq/L — ABNORMAL HIGH (ref 20.0–24.0)
O2 CONTENT: 2 L/min
O2 SAT: 90.9 %
PATIENT TEMPERATURE: 37
PO2 ART: 63.7 mmHg — AB (ref 80.0–100.0)
TCO2: 24.5 mmol/L (ref 0–100)
pCO2 arterial: 47.6 mmHg — ABNORMAL HIGH (ref 35.0–45.0)
pH, Arterial: 7.371 (ref 7.350–7.450)

## 2014-04-14 LAB — BASIC METABOLIC PANEL
Anion gap: 14 (ref 5–15)
BUN: 14 mg/dL (ref 6–23)
CO2: 28 mEq/L (ref 19–32)
Calcium: 8.7 mg/dL (ref 8.4–10.5)
Chloride: 99 mEq/L (ref 96–112)
Creatinine, Ser: 0.54 mg/dL (ref 0.50–1.10)
Glucose, Bld: 91 mg/dL (ref 70–99)
Potassium: 3.4 mEq/L — ABNORMAL LOW (ref 3.7–5.3)
SODIUM: 141 meq/L (ref 137–147)

## 2014-04-14 LAB — CBC WITH DIFFERENTIAL/PLATELET
Basophils Absolute: 0 10*3/uL (ref 0.0–0.1)
Basophils Relative: 0 % (ref 0–1)
Eosinophils Absolute: 0.2 10*3/uL (ref 0.0–0.7)
Eosinophils Relative: 1 % (ref 0–5)
HCT: 37.5 % (ref 36.0–46.0)
Hemoglobin: 12.2 g/dL (ref 12.0–15.0)
LYMPHS ABS: 2.4 10*3/uL (ref 0.7–4.0)
LYMPHS PCT: 20 % (ref 12–46)
MCH: 29.5 pg (ref 26.0–34.0)
MCHC: 32.5 g/dL (ref 30.0–36.0)
MCV: 90.6 fL (ref 78.0–100.0)
Monocytes Absolute: 1.3 10*3/uL — ABNORMAL HIGH (ref 0.1–1.0)
Monocytes Relative: 10 % (ref 3–12)
Neutro Abs: 8.3 10*3/uL — ABNORMAL HIGH (ref 1.7–7.7)
Neutrophils Relative %: 69 % (ref 43–77)
PLATELETS: 303 10*3/uL (ref 150–400)
RBC: 4.14 MIL/uL (ref 3.87–5.11)
RDW: 18 % — ABNORMAL HIGH (ref 11.5–15.5)
WBC: 12.1 10*3/uL — ABNORMAL HIGH (ref 4.0–10.5)

## 2014-04-14 LAB — URINE MICROSCOPIC-ADD ON

## 2014-04-14 LAB — RAPID URINE DRUG SCREEN, HOSP PERFORMED
AMPHETAMINES: NOT DETECTED
Barbiturates: NOT DETECTED
Benzodiazepines: NOT DETECTED
Cocaine: NOT DETECTED
Opiates: POSITIVE — AB
Tetrahydrocannabinol: NOT DETECTED

## 2014-04-14 LAB — ETHANOL: Alcohol, Ethyl (B): <11 mg/dL (ref 0–11)

## 2014-04-14 LAB — TROPONIN I: Troponin I: <0.3 ng/mL (ref <0.30)

## 2014-04-14 MED ORDER — ACETAMINOPHEN 325 MG PO TABS: 650.0000 mg | ORAL_TABLET | Freq: Four times a day (QID) | ORAL | Status: DC | PRN

## 2014-04-14 MED ORDER — ENOXAPARIN SODIUM 40 MG/0.4ML ~~LOC~~ SOLN
40.0000 mg | SUBCUTANEOUS | Status: DC
  Administered 2014-04-14 – 2014-04-16 (×3): 40 mg via SUBCUTANEOUS
  Filled 2014-04-14 (×3): qty 0.4

## 2014-04-14 MED ORDER — ONDANSETRON HCL 4 MG PO TABS: 4.0000 mg | ORAL_TABLET | Freq: Four times a day (QID) | ORAL | Status: DC | PRN

## 2014-04-14 MED ORDER — ONDANSETRON HCL 4 MG/2ML IJ SOLN: 4.0000 mg | Freq: Four times a day (QID) | INTRAMUSCULAR | Status: DC | PRN

## 2014-04-14 MED ORDER — ALBUTEROL SULFATE (2.5 MG/3ML) 0.083% IN NEBU: 2.5000 mg | INHALATION_SOLUTION | RESPIRATORY_TRACT | Status: DC | PRN

## 2014-04-14 MED ORDER — LEVOFLOXACIN IN D5W 500 MG/100ML IV SOLN
500.0000 mg | INTRAVENOUS | Status: DC
  Administered 2014-04-15 – 2014-04-17 (×3): 500 mg via INTRAVENOUS
  Filled 2014-04-14 (×3): qty 100

## 2014-04-14 MED ORDER — SODIUM CHLORIDE 0.9 % IJ SOLN
3.0000 mL | Freq: Two times a day (BID) | INTRAMUSCULAR | Status: DC
  Administered 2014-04-14 – 2014-04-16 (×3): 3 mL via INTRAVENOUS

## 2014-04-14 MED ORDER — METHYLPREDNISOLONE SODIUM SUCC 125 MG IJ SOLR
60.0000 mg | Freq: Four times a day (QID) | INTRAMUSCULAR | Status: DC
Start: 2014-04-14 — End: 2014-04-17
  Administered 2014-04-14 – 2014-04-17 (×12): 60 mg via INTRAVENOUS
  Filled 2014-04-14 (×12): qty 2

## 2014-04-14 MED ORDER — IPRATROPIUM-ALBUTEROL 0.5-2.5 (3) MG/3ML IN SOLN
3.0000 mL | Freq: Four times a day (QID) | RESPIRATORY_TRACT | Status: DC
  Administered 2014-04-15 – 2014-04-17 (×11): 3 mL via RESPIRATORY_TRACT
  Filled 2014-04-14 (×11): qty 3

## 2014-04-14 MED ORDER — SODIUM CHLORIDE 0.9 % IV SOLN: 250.0000 mL | INTRAVENOUS | Status: DC | PRN

## 2014-04-14 MED ORDER — LEVOFLOXACIN IN D5W 750 MG/150ML IV SOLN
750.0000 mg | Freq: Once | INTRAVENOUS | Status: AC
  Administered 2014-04-14: 750 mg via INTRAVENOUS
  Filled 2014-04-14: qty 150

## 2014-04-14 MED ORDER — POTASSIUM CHLORIDE CRYS ER 20 MEQ PO TBCR
40.0000 meq | EXTENDED_RELEASE_TABLET | Freq: Once | ORAL | Status: AC
  Administered 2014-04-14: 40 meq via ORAL
  Filled 2014-04-14: qty 2

## 2014-04-14 MED ORDER — SODIUM CHLORIDE 0.9 % IJ SOLN
3.0000 mL | Freq: Two times a day (BID) | INTRAMUSCULAR | Status: DC
  Administered 2014-04-14 – 2014-04-17 (×5): 3 mL via INTRAVENOUS

## 2014-04-14 MED ORDER — IPRATROPIUM BROMIDE 0.02 % IN SOLN
0.5000 mg | Freq: Four times a day (QID) | RESPIRATORY_TRACT | Status: DC
Start: 2014-04-14 — End: 2014-04-14

## 2014-04-14 MED ORDER — AMITRIPTYLINE HCL 25 MG PO TABS
25.0000 mg | ORAL_TABLET | Freq: Every day | ORAL | Status: DC
  Administered 2014-04-14 – 2014-04-16 (×3): 25 mg via ORAL
  Filled 2014-04-14 (×3): qty 1

## 2014-04-14 MED ORDER — ALBUTEROL (5 MG/ML) CONTINUOUS INHALATION SOLN
10.0000 mg/h | INHALATION_SOLUTION | Freq: Once | RESPIRATORY_TRACT | Status: AC
  Administered 2014-04-14: 10 mg/h via RESPIRATORY_TRACT
  Filled 2014-04-14: qty 20

## 2014-04-14 MED ORDER — NICOTINE 21 MG/24HR TD PT24
21.0000 mg | MEDICATED_PATCH | Freq: Every day | TRANSDERMAL | Status: DC
  Administered 2014-04-14 – 2014-04-17 (×4): 21 mg via TRANSDERMAL
  Filled 2014-04-14 (×5): qty 1

## 2014-04-14 MED ORDER — ACETAMINOPHEN 650 MG RE SUPP: 650.0000 mg | Freq: Four times a day (QID) | RECTAL | Status: DC | PRN

## 2014-04-14 MED ORDER — SODIUM CHLORIDE 0.9 % IJ SOLN: 3.0000 mL | INTRAMUSCULAR | Status: DC | PRN

## 2014-04-14 MED ORDER — ALBUTEROL SULFATE (2.5 MG/3ML) 0.083% IN NEBU: 2.5000 mg | INHALATION_SOLUTION | Freq: Four times a day (QID) | RESPIRATORY_TRACT | Status: DC

## 2014-04-14 MED ORDER — CITALOPRAM HYDROBROMIDE 20 MG PO TABS
40.0000 mg | ORAL_TABLET | Freq: Every day | ORAL | Status: DC
  Administered 2014-04-15 – 2014-04-17 (×3): 40 mg via ORAL
  Filled 2014-04-14 (×3): qty 2

## 2014-04-14 NOTE — ED Provider Notes (Signed)
CSN: [id number]     Arrival date & time 04/14/14  1649 History   First MD Initiated Contact with Patient 04/14/14 1656     Chief Complaint  Patient presents with  . Shortness of Breath      Patient is a 53 y.o. female presenting with shortness of breath. The history is provided by the patient.  Shortness of Breath Severity:  Severe Onset quality:  Gradual Duration:  2 weeks Timing:  Intermittent Progression:  Worsening Chronicity:  Recurrent Relieved by:  Nothing Worsened by:  Activity Associated symptoms: cough   Associated symptoms: no chest pain   Pt with h/o COPD (on chronic O2 2.5L daily) who has had recent worsening cough/SOB Family reports that she has had increased confusion for past 2 days.  She will have episodes where she will "black out" and "body jerk"  No seizure reported No new medications but pt does take daily pain meds] No CPR was administered.    She was seen by PCP and sent for evaluation  Past Medical History  Diagnosis Date  . COPD (chronic obstructive pulmonary disease)   . Back pain   . COPD (chronic obstructive pulmonary disease)   . Depression    Past Surgical History  Procedure Laterality Date  . Spine surgery    . Tonsillectomy Bilateral   . Chest tube insertion  2002  . Lung surgery  2002   Family History  Problem Relation Age of Onset  . Heart disease Mother   . Emphysema Maternal Aunt     smoked  . Asthma Brother    History  Substance Use Topics  . Smoking status: Current Every Day Smoker -- 1.00 packs/day for 36 years    Types: Cigarettes  . Smokeless tobacco: Current User     Comment: e-cig  . Alcohol Use: No   OB History   Grav Para Term Preterm Abortions TAB SAB Ect Mult Living                 Review of Systems  Unable to perform ROS: Mental status change  Respiratory: Positive for cough and shortness of breath.   Cardiovascular: Negative for chest pain.  Psychiatric/Behavioral: Positive for confusion.       Allergies  Hydrocodone  Home Medications   Prior to Admission medications   Medication Sig Start Date End Date Taking? Authorizing Provider  albuterol (PROAIR HFA) 108 (90 BASE) MCG/ACT inhaler Inhale 2 puffs into the lungs every 6 (six) hours as needed for wheezing or shortness of breath. 01/26/14  Yes Lysbeth Penner, FNP  albuterol (PROVENTIL) (2.5 MG/3ML) 0.083% nebulizer solution Take 3ml nebulizer treatment with ipratropium 4 times a days as needed 01/06/14  Yes Lysbeth Penner, FNP  amitriptyline (ELAVIL) 25 MG tablet Take 1 tablet (25 mg total) by mouth at bedtime. 01/26/14  Yes Lysbeth Penner, FNP  B Complex-C (B-COMPLEX WITH VITAMIN C) tablet Take 1 tablet by mouth daily.   Yes Historical Provider, MD  budesonide-formoterol (SYMBICORT) 160-4.5 MCG/ACT inhaler Take 2 puffs first thing in am and then another 2 puffs about 12 hours later. 03/21/14  Yes Tanda Rockers, MD  citalopram (CELEXA) 40 MG tablet Take 1 tablet (40 mg total) by mouth daily. 09/20/13  Yes Lysbeth Penner, FNP  omeprazole (PRILOSEC) 20 MG capsule Take 1 capsule (20 mg total) by mouth daily. 09/20/13  Yes Lysbeth Penner, FNP  tiotropium (SPIRIVA) 18 MCG inhalation capsule Place 1 capsule (18 mcg total) into inhaler  and inhale daily. 03/21/14  Yes Tanda Rockers, MD  traMADol (ULTRAM) 50 MG tablet Take 50 mg by mouth 3 (three) times daily. 03/30/14  Yes Orson Ape Oxford, FNP   BP 106/62  Pulse 90  Temp(Src) 98.2 F (36.8 C) (Oral)  Resp 18  Ht 5\' 4"  (1.626 m)  Wt 117 lb (53.071 kg)  BMI 20.07 kg/m2  SpO2 90%  LMP 09/21/2003 Physical Exam CONSTITUTIONAL: frail, ill appearing HEAD: Normocephalic/atraumatic EYES: EOMI/PERRL ENMT: Mucous membranes moist NECK: supple no meningeal signs SPINE:entire spine nontender CV: S1/S2 noted, no murmurs/rubs/gallops noted LUNGS: coarse wheezing noted bilaterally.   ABDOMEN: soft, nontender, no rebound or guarding GU:no cva tenderness NEURO: Pt is somnolent but  arousable.  When awake she follows commands and has no arm/leg drift and no facial droop.  She will fall back asleep soon after speaking to her EXTREMITIES: pulses normal, full ROM SKIN: warm, color normal PSYCH: unable to assess  ED Course  Procedures     For her COPD, pt was already given solumedrol and nebs en route.  She was given more albuterol here with continued wheeze.  Will admit for treatment/monitoring  For her altered mental status, CT head negative.  No focal weakness to suggest stroke.  She was more alert but suspect this may be related to her pain meds.   Will admit for further monitoring/workup D/w dr Darrick Meigs will admit  Labs Review Labs Reviewed  BASIC METABOLIC PANEL - Abnormal; Notable for the following:    Potassium 3.4 (*)    All other components within normal limits  CBC WITH DIFFERENTIAL - Abnormal; Notable for the following:    WBC 12.1 (*)    RDW 18.0 (*)    Neutro Abs 8.3 (*)    Monocytes Absolute 1.3 (*)    All other components within normal limits  URINE RAPID DRUG SCREEN (HOSP PERFORMED) - Abnormal; Notable for the following:    Opiates POSITIVE (*)    All other components within normal limits  URINALYSIS, ROUTINE W REFLEX MICROSCOPIC - Abnormal; Notable for the following:    Specific Gravity, Urine >1.030 (*)    Hgb urine dipstick TRACE (*)    Ketones, ur 15 (*)    Protein, ur TRACE (*)    All other components within normal limits  BLOOD GAS, ARTERIAL - Abnormal; Notable for the following:    pCO2 arterial 47.6 (*)    pO2, Arterial 63.7 (*)    Bicarbonate 26.9 (*)    Acid-Base Excess 2.2 (*)    All other components within normal limits  ETHANOL  TROPONIN I  URINE MICROSCOPIC-ADD ON  CBC  COMPREHENSIVE METABOLIC PANEL    Imaging Review Ct Head Wo Contrast  04/14/2014   CLINICAL DATA:  Confusion  EXAM: CT HEAD WITHOUT CONTRAST  TECHNIQUE: Contiguous axial images were obtained from the base of the skull through the vertex without intravenous  contrast.  COMPARISON:  None.  FINDINGS: There is a degree of motion artifact making this study less than optimal. The ventricles are normal in size and configuration. There is no apparent mass, hemorrhage, extra-axial fluid collection, or midline shift. Gray-white compartments appear normal. No acute infarct apparent. Bony calvarium appears intact. The mastoid air cells are clear.  IMPRESSION: Study within normal limits. No appreciable intracranial mass, hemorrhage, or focal gray -white compartment lesion.   Electronically Signed   By: Lowella Grip M.D.   On: 04/14/2014 18:33   Dg Chest Portable 1 View  04/14/2014  CLINICAL DATA:  Chest pain and difficulty breathing  EXAM: PORTABLE CHEST - 1 VIEW  COMPARISON:  March 21, 2014  FINDINGS: There is generalized interstitial prominence, probably reflecting chronic interstitial fibrosis. There is a degree of underlying emphysema. There is no airspace consolidation. The heart size is within normal limits. Pulmonary vascularity reflects underlying emphysema. No adenopathy. There is atherosclerotic change in the aorta.  IMPRESSION: Underlying emphysema with underlying interstitial pneumonitis. A mild degree of chronic congestive heart failure cannot be excluded radiographically, however. There is no airspace consolidation.   Electronically Signed   By: Lowella Grip M.D.   On: 04/14/2014 17:49     EKG Interpretation   Date/Time:  Thursday April 14 2014 16:54:51 EDT Ventricular Rate:  92 PR Interval:  147 QRS Duration: 88 QT Interval:  341 QTC Calculation: 422 R Axis:   51 Text Interpretation:  Sinus rhythm Non-specific ST-t changes No previous  ECGs available Confirmed by Christy Gentles  MD, Elenore Rota ([id number]) on 04/14/2014  5:00:17 PM      MDM   Final diagnoses:  Pulmonary emphysema, unspecified emphysema type  Altered mental status, unspecified altered mental status type    Nursing notes including past medical history and social history  reviewed and considered in documentation Labs/vital reviewed and considered xrays reviewed and considered     Sharyon Cable, MD 04/14/14 2204

## 2014-04-14 NOTE — H&P (Signed)
PCP:   Redge Gainer, MD   Chief Complaint:  Shortness of breath  HPI:  53 year old female who  has a past medical history of COPD (chronic obstructive pulmonary disease); Back pain; COPD (chronic obstructive pulmonary disease); and Depression. Today presents to the ED with worsening shortness of breath over the past few days. Patient has history of COPD, and his current smoker with smoking one pack per day, has home oxygen. As per patient's sister, patient has been more lethargic, intermittently confused and short of breath over the past few days. Also has been coughing up phlegm intermittently mostly white in color. She denies fever but admits to having some chills. She denies chest pain nausea vomiting or diarrhea. Patient has been on home oxygen for many years, she has an appointment to see pulmonology as outpatient. In the ED patient was given nebulizer treatments and is currently breathing better. She is requiring 2 L of oxygen and saturation of oxygen is more than 90%.  Allergies:   Allergies  Allergen Reactions  . Hydrocodone Itching      Past Medical History  Diagnosis Date  . COPD (chronic obstructive pulmonary disease)   . Back pain   . COPD (chronic obstructive pulmonary disease)   . Depression     Past Surgical History  Procedure Laterality Date  . Spine surgery    . Tonsillectomy Bilateral   . Chest tube insertion  2002  . Lung surgery  2002    Prior to Admission medications   Medication Sig Start Date End Date Taking? Authorizing Provider  ADVAIR DISKUS 250-50 MCG/DOSE AEPB  02/20/14   Historical Provider, MD  albuterol (PROAIR HFA) 108 (90 BASE) MCG/ACT inhaler Inhale 2 puffs into the lungs every 6 (six) hours as needed for wheezing or shortness of breath. 01/26/14   Lysbeth Penner, FNP  albuterol (PROVENTIL) (2.5 MG/3ML) 0.083% nebulizer solution Take 81ml nebulizer treatment with ipratropium 4 times a days as needed 01/06/14   Lysbeth Penner, FNP    amitriptyline (ELAVIL) 25 MG tablet Take 1 tablet (25 mg total) by mouth at bedtime. 01/26/14   Lysbeth Penner, FNP  B Complex-C (B-COMPLEX WITH VITAMIN C) tablet Take 1 tablet by mouth daily.    Historical Provider, MD  benzonatate (TESSALON PERLES) 100 MG capsule Take 100 mg by mouth 3 (three) times daily as needed.  02/14/14   Historical Provider, MD  budesonide-formoterol (SYMBICORT) 160-4.5 MCG/ACT inhaler Take 2 puffs first thing in am and then another 2 puffs about 12 hours later. 03/21/14   Tanda Rockers, MD  citalopram (CELEXA) 40 MG tablet Take 1 tablet (40 mg total) by mouth daily. 09/20/13   Lysbeth Penner, FNP  omeprazole (PRILOSEC) 20 MG capsule Take 1 capsule (20 mg total) by mouth daily. 09/20/13   Lysbeth Penner, FNP  tiotropium (SPIRIVA) 18 MCG inhalation capsule Place 1 capsule (18 mcg total) into inhaler and inhale daily. 03/21/14   Tanda Rockers, MD  traMADol (ULTRAM) 50 MG tablet Take 2 tablets (100 mg total) by mouth every 6 (six) hours as needed. 03/30/14   Lysbeth Penner, FNP    Social History:  reports that she has been smoking Cigarettes.  She has a 36 pack-year smoking history. She uses smokeless tobacco. She reports that she does not drink alcohol or use illicit drugs.  Family History  Problem Relation Age of Onset  . Heart disease Mother   . Emphysema Maternal Aunt     smoked  .  Asthma Brother      All the positives are listed in BOLD  Review of Systems:  HEENT: Headache, blurred vision, runny nose, sore throat Neck: Hypothyroidism, hyperthyroidism,,lymphadenopathy Chest : Shortness of breath, history of COPD, Asthma Heart : Chest pain, history of coronary arterey disease GI:  Nausea, vomiting, diarrhea, constipation, GERD GU: Dysuria, urgency, frequency of urination, hematuria Neuro: Stroke, seizures, syncope Psych: Depression, anxiety, hallucinations   Physical Exam: Blood pressure 113/72, pulse 93, temperature 98.6 F (37 C), temperature source  Rectal, resp. rate 18, height 5\' 4"  (1.626 m), weight 53.071 kg (117 lb), last menstrual period 09/21/2003, SpO2 90.00%. Constitutional:   Patient is a well-developed and well-nourished female* in no acute distress and cooperative with exam. Head: Normocephalic and atraumatic Mouth: Mucus membranes moist Eyes: PERRL, EOMI, conjunctivae normal Neck: Supple, No Thyromegaly Cardiovascular: RRR, S1 normal, S2 normal Pulmonary/Chest: Bilateral rhonchi on auscultation in the anterior lung fields Abdominal: Soft. Non-tender, non-distended, bowel sounds are normal, no masses, organomegaly, or guarding present.  Neurological: A&O x3, Strenght is normal and symmetric bilaterally, cranial nerve II-XII are grossly intact, no focal motor deficit, sensory intact to light touch bilaterally.  Extremities : No Cyanosis, Clubbing or Edema  Labs on Admission:  Basic Metabolic Panel:  Recent Labs Lab 04/14/14 1703  NA 141  K 3.4*  CL 99  CO2 28  GLUCOSE 91  BUN 14  CREATININE 0.54  CALCIUM 8.7   CBC:  Recent Labs Lab 04/14/14 1703  WBC 12.1*  NEUTROABS 8.3*  HGB 12.2  HCT 37.5  MCV 90.6  PLT 303   Cardiac Enzymes:  Recent Labs Lab 04/14/14 1703  TROPONINI <0.30    BNP (last 3 results) No results found for this basename: PROBNP,  in the last 8760 hours CBG: No results found for this basename: GLUCAP,  in the last 168 hours  Radiological Exams on Admission: Ct Head Wo Contrast  04/14/2014   CLINICAL DATA:  Confusion  EXAM: CT HEAD WITHOUT CONTRAST  TECHNIQUE: Contiguous axial images were obtained from the base of the skull through the vertex without intravenous contrast.  COMPARISON:  None.  FINDINGS: There is a degree of motion artifact making this study less than optimal. The ventricles are normal in size and configuration. There is no apparent mass, hemorrhage, extra-axial fluid collection, or midline shift. Gray-white compartments appear normal. No acute infarct apparent. Bony  calvarium appears intact. The mastoid air cells are clear.  IMPRESSION: Study within normal limits. No appreciable intracranial mass, hemorrhage, or focal gray -white compartment lesion.   Electronically Signed   By: Lowella Grip M.D.   On: 04/14/2014 18:33   Dg Chest Portable 1 View  04/14/2014   CLINICAL DATA:  Chest pain and difficulty breathing  EXAM: PORTABLE CHEST - 1 VIEW  COMPARISON:  March 21, 2014  FINDINGS: There is generalized interstitial prominence, probably reflecting chronic interstitial fibrosis. There is a degree of underlying emphysema. There is no airspace consolidation. The heart size is within normal limits. Pulmonary vascularity reflects underlying emphysema. No adenopathy. There is atherosclerotic change in the aorta.  IMPRESSION: Underlying emphysema with underlying interstitial pneumonitis. A mild degree of chronic congestive heart failure cannot be excluded radiographically, however. There is no airspace consolidation.   Electronically Signed   By: Lowella Grip M.D.   On: 04/14/2014 17:49    EKG: Independently reviewed. Sinus rhythm   Assessment/Plan Active Problems:   Smoker   COPD exacerbation   Hypokalemia  COPD exacerbation We'll admit the patient  under observation, start salmeterol 60 mg IV every 6 hours, DuoNeb nebulizers every 6 hours with albuterol every 2 hours when necessary. Patient has received one dose of Levaquin in the ED, will continue Levaquin 500 mg IV daily. Patient to follow up with pulmonary as outpatient  Tobacco abuse Patient is currently smoking one pack per day, strong counseling given for tobacco abuse Will give nicotine patch daily while in the hospital. Patient may follow up with primary care provider to discuss other options including Chantix and bupropion  Hypokalemia Replace potassium  Code status: Patient is full code  Family discussion: Admission, patients condition and plan of care including tests being ordered have  been discussed with the patient and her sister at bedside* who indicate understanding and agree with the plan and Code Status.   Time Spent on Admission: 65 minutes  [facility] Pager: [phone] 04/14/2014, 8:12 PM  If 7PM-7AM, please contact night-coverage  [email or web address]  Password [email or web address]

## 2014-04-14 NOTE — ED Notes (Signed)
Per pt and family she has been getting choked when she drinks.

## 2014-04-14 NOTE — ED Notes (Signed)
Sob times 2 weeks.  Went to pcp today and office called ems to transport to hospital.  ems gave pt 2  albuteral and atrovent nebs.  125 solumedrol iv.   Pt still sob .

## 2014-04-14 NOTE — Progress Notes (Signed)
   Subjective:    Patient ID: Kristy Moore, female    DOB: 03/22/1961, 53 y.o.   MRN: [id number]  HPI  Patient is brought by family member with c/o SOB, weakness, confusion, near syncope, And feeling bad for last few days.  She has hx of Copd, hypoxia, tobacco abuse, chronic arthritis pain, And depression.  She is not wearing her nasal cannula oxygen and saturations 86% on room air.  Review of Systems No chest pain, SOB, HA, dizziness, vision change, N/V, diarrhea, constipation, dysuria, urinary urgency or frequency, myalgias, arthralgias or rash.     Objective:   Physical Exam Vital signs noted  Acutely ill cachetic female.  HEENT - Head atraumatic Normocephalic                Eyes - PERRLA, Conjuctiva - clear Sclera- Clear EOMI                Ears - EAC's Wnl TM's Wnl Gross Hearing WNL                Throat - oropharanx wnl Respiratory - Lungs diminished with exp wheezes and using upper accessory muscles.  Oxygen saturaton comes up to91% on room air after neb treatment. Cardiac - RRR S1 and S2 without murmur GI - Abdomen soft Nontender and bowel sounds active x 4 Extremities - No edema. Neuro - confused       Assessment & Plan:  COPD exacerbation Recommend patient go to ED and she accepts, EMS called, oxygen 2 liters started and IV started, And patient is taken to ED via EMS.  Lysbeth Penner FNP

## 2014-04-15 DIAGNOSIS — F17200 Nicotine dependence, unspecified, uncomplicated: Secondary | ICD-10-CM

## 2014-04-15 DIAGNOSIS — E876 Hypokalemia: Secondary | ICD-10-CM | POA: Diagnosis not present

## 2014-04-15 DIAGNOSIS — Z8249 Family history of ischemic heart disease and other diseases of the circulatory system: Secondary | ICD-10-CM | POA: Diagnosis not present

## 2014-04-15 DIAGNOSIS — J9620 Acute and chronic respiratory failure, unspecified whether with hypoxia or hypercapnia: Secondary | ICD-10-CM | POA: Diagnosis not present

## 2014-04-15 DIAGNOSIS — R0602 Shortness of breath: Secondary | ICD-10-CM | POA: Diagnosis present

## 2014-04-15 DIAGNOSIS — Z9981 Dependence on supplemental oxygen: Secondary | ICD-10-CM | POA: Diagnosis not present

## 2014-04-15 DIAGNOSIS — Z825 Family history of asthma and other chronic lower respiratory diseases: Secondary | ICD-10-CM | POA: Diagnosis not present

## 2014-04-15 DIAGNOSIS — J438 Other emphysema: Secondary | ICD-10-CM | POA: Diagnosis not present

## 2014-04-15 DIAGNOSIS — J441 Chronic obstructive pulmonary disease with (acute) exacerbation: Secondary | ICD-10-CM | POA: Diagnosis not present

## 2014-04-15 LAB — COMPREHENSIVE METABOLIC PANEL
ALK PHOS: 82 U/L (ref 39–117)
ALT: 10 U/L (ref 0–35)
AST: 22 U/L (ref 0–37)
Albumin: 3.4 g/dL — ABNORMAL LOW (ref 3.5–5.2)
Anion gap: 13 (ref 5–15)
BUN: 16 mg/dL (ref 6–23)
CHLORIDE: 99 meq/L (ref 96–112)
CO2: 28 mEq/L (ref 19–32)
Calcium: 9.1 mg/dL (ref 8.4–10.5)
Creatinine, Ser: 0.53 mg/dL (ref 0.50–1.10)
GFR calc non Af Amer: >90 mL/min (ref >90)
GLUCOSE: 133 mg/dL — AB (ref 70–99)
POTASSIUM: 4 meq/L (ref 3.7–5.3)
Sodium: 140 mEq/L (ref 137–147)
TOTAL PROTEIN: 7.4 g/dL (ref 6.0–8.3)
Total Bilirubin: <0.2 mg/dL — ABNORMAL LOW (ref 0.3–1.2)

## 2014-04-15 LAB — CBC
HEMATOCRIT: 37.8 % (ref 36.0–46.0)
HEMOGLOBIN: 12.3 g/dL (ref 12.0–15.0)
MCH: 29.4 pg (ref 26.0–34.0)
MCHC: 32.5 g/dL (ref 30.0–36.0)
MCV: 90.4 fL (ref 78.0–100.0)
Platelets: 313 10*3/uL (ref 150–400)
RBC: 4.18 MIL/uL (ref 3.87–5.11)
RDW: 17.9 % — ABNORMAL HIGH (ref 11.5–15.5)
WBC: 8.8 10*3/uL (ref 4.0–10.5)

## 2014-04-15 MED ORDER — DIPHENHYDRAMINE HCL 25 MG PO CAPS
25.0000 mg | ORAL_CAPSULE | Freq: Once | ORAL | Status: AC
  Administered 2014-04-15: 25 mg via ORAL
  Filled 2014-04-15: qty 1

## 2014-04-15 MED ORDER — GUAIFENESIN ER 600 MG PO TB12
1200.0000 mg | ORAL_TABLET | Freq: Two times a day (BID) | ORAL | Status: DC
  Administered 2014-04-15 – 2014-04-17 (×4): 1200 mg via ORAL
  Filled 2014-04-15 (×4): qty 2

## 2014-04-15 NOTE — Progress Notes (Signed)
[facility] PROGRESS NOTE  Kristy Moore YQM:[id number] DOB: 1961/05/12 DOA: 04/14/2014 PCP: Redge Gainer, MD  Assessment/Plan: 1. Exacerbation of COPD. Patient does feel mildly improved since admission. We'll continue the patient on intravenous steroids, bronchodilators and antibiotics. Add Mucinex and flutter valve. 2. Chronic respiratory failure. Patient does report she uses oxygen while she sleeps. She may need to be discharged on continuous oxygen. Continue current treatments 3. Tobacco abuse. Counseled on the importance of tobacco cessation 4. Hypokalemia. Improved  Code Status: full code Family Communication: discussed with patient Disposition Plan: discharge home once improved   Consultants:    Procedures:    Antibiotics:  levaquin 8/27  HPI/Subjective: Feels that breathing is improving since admission. She has a non productive cough  Objective: Filed Vitals:   04/15/14 1437  BP: 125/74  Pulse: 91  Temp: 98.7 F (37.1 C)  Resp: 16    Intake/Output Summary (Last 24 hours) at 04/15/14 1923 Last data filed at 04/15/14 1800  Gross per 24 hour  Intake    720 ml  Output    300 ml  Net    420 ml   Filed Weights   04/14/14 1654 04/14/14 2111  Weight: 53.071 kg (117 lb) 54.069 kg (119 lb 3.2 oz)    Exam:   General:  NAD  Cardiovascular: s1, s2, rrr  Respiratory: mild expiratory wheezes  Abdomen: soft, nt, nd, bs+  Musculoskeletal:  No edema b/l   Data Reviewed: Basic Metabolic Panel:  Recent Labs Lab 04/14/14 1703 04/15/14 0635  NA 141 140  K 3.4* 4.0  CL 99 99  CO2 28 28  GLUCOSE 91 133*  BUN 14 16  CREATININE 0.54 0.53  CALCIUM 8.7 9.1   Liver Function Tests:  Recent Labs Lab 04/15/14 0635  AST 22  ALT 10  ALKPHOS 82  BILITOT <0.2*  PROT 7.4  ALBUMIN 3.4*   No results found for this basename: LIPASE, AMYLASE,  in the last 168 hours No results found for this basename: AMMONIA,  in the last 168  hours CBC:  Recent Labs Lab 04/14/14 1703 04/15/14 0635  WBC 12.1* 8.8  NEUTROABS 8.3*  --   HGB 12.2 12.3  HCT 37.5 37.8  MCV 90.6 90.4  PLT 303 313   Cardiac Enzymes:  Recent Labs Lab 04/14/14 1703  TROPONINI <0.30   BNP (last 3 results) No results found for this basename: PROBNP,  in the last 8760 hours CBG: No results found for this basename: GLUCAP,  in the last 168 hours  No results found for this or any previous visit (from the past 240 hour(s)).   Studies: Ct Head Wo Contrast  04/14/2014   CLINICAL DATA:  Confusion  EXAM: CT HEAD WITHOUT CONTRAST  TECHNIQUE: Contiguous axial images were obtained from the base of the skull through the vertex without intravenous contrast.  COMPARISON:  None.  FINDINGS: There is a degree of motion artifact making this study less than optimal. The ventricles are normal in size and configuration. There is no apparent mass, hemorrhage, extra-axial fluid collection, or midline shift. Gray-white compartments appear normal. No acute infarct apparent. Bony calvarium appears intact. The mastoid air cells are clear.  IMPRESSION: Study within normal limits. No appreciable intracranial mass, hemorrhage, or focal gray -white compartment lesion.   Electronically Signed   By: Lowella Grip M.D.   On: 04/14/2014 18:33   Dg Chest Portable 1 View  04/14/2014   CLINICAL DATA:  Chest pain and difficulty breathing  EXAM: PORTABLE  CHEST - 1 VIEW  COMPARISON:  March 21, 2014  FINDINGS: There is generalized interstitial prominence, probably reflecting chronic interstitial fibrosis. There is a degree of underlying emphysema. There is no airspace consolidation. The heart size is within normal limits. Pulmonary vascularity reflects underlying emphysema. No adenopathy. There is atherosclerotic change in the aorta.  IMPRESSION: Underlying emphysema with underlying interstitial pneumonitis. A mild degree of chronic congestive heart failure cannot be excluded  radiographically, however. There is no airspace consolidation.   Electronically Signed   By: Lowella Grip M.D.   On: 04/14/2014 17:49    Scheduled Meds: . amitriptyline  25 mg Oral QHS  . citalopram  40 mg Oral Daily  . enoxaparin (LOVENOX) injection  40 mg Subcutaneous Q24H  . ipratropium-albuterol  3 mL Nebulization Q6H  . levofloxacin (LEVAQUIN) IV  500 mg Intravenous Q24H  . methylPREDNISolone (SOLU-MEDROL) injection  60 mg Intravenous Q6H  . nicotine  21 mg Transdermal Daily  . sodium chloride  3 mL Intravenous Q12H  . sodium chloride  3 mL Intravenous Q12H   Continuous Infusions:   Active Problems:   Smoker   COPD exacerbation   Hypokalemia    Time spent: 96mins    Kristy Moore  [facility] Pager [phone]. If 7PM-7AM, please contact night-coverage at [email or web address], password [facility] 04/15/2014, 7:23 PM  LOS: 1 day

## 2014-04-15 NOTE — Progress Notes (Signed)
UR completed 

## 2014-04-15 NOTE — Care Management Note (Addendum)
    Page 1 of 1   04/21/2014     3:53:45 PM CARE MANAGEMENT NOTE 04/21/2014  Patient:  Kristy Moore, Kristy Moore   Account Number:  [id number]  Date Initiated:  04/15/2014  Documentation initiated by:  Jolene Provost  Subjective/Objective Assessment:   Pt is from home, lives with sister. Pt has cane that she uses PRN, home O2, from [employer], and a neb machine. Pt has a PCP in [place] but cannot recall his name. Pt has no HH services or medication needs prior to admission     Action/Plan:   Pt plans to discharge home with self care. No CM needs identified at this time.   Anticipated DC Date:  04/16/2014   Anticipated DC Plan:  [place]  CM consult      Choice offered to / List presented to:             Status of service:  Completed, signed off Medicare Important Message given?   (If response is "NO", the following Medicare IM given date fields will be blank) Date Medicare IM given:   Medicare IM given by:   Date Additional Medicare IM given:   Additional Medicare IM given by:    Discharge Disposition:  HOME/SELF CARE  Per UR Regulation:    If discussed at Long Length of Stay Meetings, dates discussed:    Comments:  04/21/2014 [place], RN, MSN, PCCN Contacted by pt's sister who says that Oxygen was never delivered. [employer] contacted and re-faxed facesheet, O2 needs assesment note, MD order and discharge summary. Oxygen company confirmed reciept of documentation and told to call me back if there was any trouble fulfilling order. Truddie Crumble, from [employer], will contact pt.  04/18/2014 [place], RN, MSN, PCCN pt discharged home over the weekend. Note left for CM that Pt's Oxygen company was not able to be contacted and pt needed cont. home O2 set up. Pt called and number recieved for [facility]. [employer] called and orders and facesheet faxed information. Pt to recieve cont and portable O2 tanks at home. 04/15/2014 [place], RN, MSN, [facility]

## 2014-04-16 DIAGNOSIS — R0902 Hypoxemia: Secondary | ICD-10-CM

## 2014-04-16 DIAGNOSIS — J9620 Acute and chronic respiratory failure, unspecified whether with hypoxia or hypercapnia: Secondary | ICD-10-CM

## 2014-04-16 MED ORDER — BENZONATATE 100 MG PO CAPS
200.0000 mg | ORAL_CAPSULE | Freq: Once | ORAL | Status: AC
  Administered 2014-04-16: 200 mg via ORAL
  Filled 2014-04-16: qty 2

## 2014-04-16 NOTE — Progress Notes (Signed)
O2 Saturations:  Patient's O2 saturation is 91% at rest on RA.  Patient's O2 saturation is 85% with activity on RA.  Patient's O2 saturation is 93% at rest on 3LNC.  Patient's O2 saturation is 89% with activity on 3LNC.

## 2014-04-16 NOTE — Progress Notes (Signed)
[facility] PROGRESS NOTE  Kristy Moore KDX:[id number] DOB: [birth year] DOA: 04/14/2014 PCP: Redge Gainer, MD  Assessment/Plan: 1. Exacerbation of COPD. Patient does feel mildly improved since admission but still becomes hypoxic on ambulation with oxygen. We'll continue the patient on intravenous steroids, bronchodilators and antibiotics. 2. Acute on Chronic respiratory failure. Patient does report she uses oxygen while she sleeps. She'll likely need to discharge on continuous oxygen. She was ambulated today on oxygen and still became hypoxic. Continue current treatments 3. Tobacco abuse. Counseled on the importance of tobacco cessation 4. Hypokalemia. Improved  Code Status: full code Family Communication: discussed with patient Disposition Plan: discharge home once improved   Consultants:    Procedures:    Antibiotics:  levaquin 8/27  HPI/Subjective: Feeling better. Continues to cough. Feels shortness of breath is improving  Objective: Filed Vitals:   04/16/14 1300  BP: 124/72  Pulse: 87  Temp: 98.7 F (37.1 C)  Resp: 20    Intake/Output Summary (Last 24 hours) at 04/16/14 1942 Last data filed at 04/16/14 1804  Gross per 24 hour  Intake    720 ml  Output      0 ml  Net    720 ml   Filed Weights   04/14/14 1654 04/14/14 2111 04/16/14 0542  Weight: 53.071 kg (117 lb) 54.069 kg (119 lb 3.2 oz) 53.661 kg (118 lb 4.8 oz)    Exam:   General:  NAD  Cardiovascular: s1, s2, rrr  Respiratory: mild expiratory wheezes  Abdomen: soft, nt, nd, bs+  Musculoskeletal:  No edema b/l   Data Reviewed: Basic Metabolic Panel:  Recent Labs Lab 04/14/14 1703 04/15/14 0635  NA 141 140  K 3.4* 4.0  CL 99 99  CO2 28 28  GLUCOSE 91 133*  BUN 14 16  CREATININE 0.54 0.53  CALCIUM 8.7 9.1   Liver Function Tests:  Recent Labs Lab 04/15/14 0635  AST 22  ALT 10  ALKPHOS 82  BILITOT <0.2*  PROT 7.4  ALBUMIN 3.4*   No results found for this  basename: LIPASE, AMYLASE,  in the last 168 hours No results found for this basename: AMMONIA,  in the last 168 hours CBC:  Recent Labs Lab 04/14/14 1703 04/15/14 0635  WBC 12.1* 8.8  NEUTROABS 8.3*  --   HGB 12.2 12.3  HCT 37.5 37.8  MCV 90.6 90.4  PLT 303 313   Cardiac Enzymes:  Recent Labs Lab 04/14/14 1703  TROPONINI <0.30   BNP (last 3 results) No results found for this basename: PROBNP,  in the last 8760 hours CBG: No results found for this basename: GLUCAP,  in the last 168 hours  No results found for this or any previous visit (from the past 240 hour(s)).   Studies: No results found.  Scheduled Meds: . amitriptyline  25 mg Oral QHS  . citalopram  40 mg Oral Daily  . enoxaparin (LOVENOX) injection  40 mg Subcutaneous Q24H  . guaiFENesin  1,200 mg Oral BID  . ipratropium-albuterol  3 mL Nebulization Q6H  . levofloxacin (LEVAQUIN) IV  500 mg Intravenous Q24H  . methylPREDNISolone (SOLU-MEDROL) injection  60 mg Intravenous Q6H  . nicotine  21 mg Transdermal Daily  . sodium chloride  3 mL Intravenous Q12H  . sodium chloride  3 mL Intravenous Q12H   Continuous Infusions:   Active Problems:   Smoker   COPD exacerbation   Hypokalemia    Time spent: 87mins    [name]  [facility] Pager [phone]. If  7PM-7AM, please contact night-coverage at [email or web address], password [facility] 04/16/2014, 7:42 PM  LOS: 2 days

## 2014-04-17 MED ORDER — NICOTINE 21 MG/24HR TD PT24: 21.0000 mg | MEDICATED_PATCH | Freq: Every day | TRANSDERMAL | Status: DC

## 2014-04-17 MED ORDER — LEVOFLOXACIN 500 MG PO TABS: 500.0000 mg | ORAL_TABLET | Freq: Every day | ORAL | Status: DC

## 2014-04-17 MED ORDER — GUAIFENESIN ER 600 MG PO TB12: 1200.0000 mg | ORAL_TABLET | Freq: Two times a day (BID) | ORAL | Status: DC

## 2014-04-17 MED ORDER — PREDNISONE 10 MG PO TABS: ORAL_TABLET | ORAL | Status: DC

## 2014-04-17 NOTE — Discharge Summary (Signed)
Physician Discharge Summary  Kristy Moore NLZ:[id number] DOB: 29-Apr-1961 DOA: 04/14/2014  PCP: Redge Gainer, MD  Admit date: 04/14/2014 Discharge date: 04/17/2014  Time spent: 40 minutes  Recommendations for Outpatient Follow-up:  1. Patient will be discharged home on continuous oxygen. She will to followup with her primary care physician in one to 2 weeks  Discharge Diagnoses:  Active Problems:   Smoker   COPD exacerbation   Hypokalemia   Acute-on-chronic respiratory failure   Discharge Condition: Improved  Diet recommendation: Low-salt  Filed Weights   04/14/14 1654 04/14/14 2111 04/16/14 0542  Weight: 53.071 kg (117 lb) 54.069 kg (119 lb 3.2 oz) 53.661 kg (118 lb 4.8 oz)    History of present illness and hospital course:  This patient was admitted to the hospital with progressive shortness of breath. She is a current cigarette smoker and has a history of COPD. She was admitted with a COPD exacerbation. She was started on intravenous steroids, bronchodilators and antibiotics. She is chronically on oxygen at night. With treatment, her respiratory status improved. Her shortness of breath has significantly improved her wheezing has resolved. She still requiring supplemental oxygen during the day and will be discharged home on the same. She will need to follow up with primary care physician next 1-2 weeks to see if she still needs to be on oxygen during the day. She was strongly advised to quit smoking. She has been placed on a prednisone taper. Patient has been ambulated and feels that she is ready for discharge home.  Procedures:    Consultations:    Discharge Exam: Filed Vitals:   04/17/14 1405  BP: 141/75  Pulse: 75  Temp: 98.7 F (37.1 C)  Resp: 20    General: No acute distress Cardiovascular: S1, S2, regular rhythm Respiratory: Fair air entry bilaterally, no wheezing  Discharge Instructions You were cared for by a hospitalist during your hospital stay. If  you have any questions about your discharge medications or the care you received while you were in the hospital after you are discharged, you can call the unit and asked to speak with the hospitalist on call if the hospitalist that took care of you is not available. Once you are discharged, your primary care physician will handle any further medical issues. Please note that NO REFILLS for any discharge medications will be authorized once you are discharged, as it is imperative that you return to your primary care physician (or establish a relationship with a primary care physician if you do not have one) for your aftercare needs so that they can reassess your need for medications and monitor your lab values.  Discharge Instructions   Call MD for:  difficulty breathing, headache or visual disturbances    Complete by:  As directed      Call MD for:  temperature >100.4    Complete by:  As directed      Diet - low sodium heart healthy    Complete by:  As directed      Increase activity slowly    Complete by:  As directed             Medication List         albuterol (2.5 MG/3ML) 0.083% nebulizer solution  Commonly known as:  PROVENTIL  Take 104ml nebulizer treatment with ipratropium 4 times a days as needed     albuterol 108 (90 BASE) MCG/ACT inhaler  Commonly known as:  PROAIR HFA  Inhale 2 puffs into the lungs every  6 (six) hours as needed for wheezing or shortness of breath.     amitriptyline 25 MG tablet  Commonly known as:  ELAVIL  Take 1 tablet (25 mg total) by mouth at bedtime.     B-complex with vitamin C tablet  Take 1 tablet by mouth daily.     budesonide-formoterol 160-4.5 MCG/ACT inhaler  Commonly known as:  SYMBICORT  Take 2 puffs first thing in am and then another 2 puffs about 12 hours later.     citalopram 40 MG tablet  Commonly known as:  CELEXA  Take 1 tablet (40 mg total) by mouth daily.     guaiFENesin 600 MG 12 hr tablet  Commonly known as:  MUCINEX  Take 2  tablets (1,200 mg total) by mouth 2 (two) times daily.     levofloxacin 500 MG tablet  Commonly known as:  LEVAQUIN  Take 1 tablet (500 mg total) by mouth daily.     nicotine 21 mg/24hr patch  Commonly known as:  NICODERM CQ - dosed in mg/24 hours  Place 1 patch (21 mg total) onto the skin daily.     omeprazole 20 MG capsule  Commonly known as:  PRILOSEC  Take 1 capsule (20 mg total) by mouth daily.     predniSONE 10 MG tablet  Commonly known as:  DELTASONE  Take 40mg  po daily for 2 days then 30mg  po daily for 2 days then 20mg  po daily for 2 days then 10mg  po daily for 2 days then stop     tiotropium 18 MCG inhalation capsule  Commonly known as:  SPIRIVA  Place 1 capsule (18 mcg total) into inhaler and inhale daily.     traMADol 50 MG tablet  Commonly known as:  ULTRAM  Take 50 mg by mouth 3 (three) times daily.       Allergies  Allergen Reactions  . Hydrocodone Itching       Follow-up Information   Follow up with Redge Gainer, MD. Schedule an appointment as soon as possible for a visit in 2 weeks.   Specialty:  Family Medicine   Contact information:   [place] [phone]        The results of significant diagnostics from this hospitalization (including imaging, microbiology, ancillary and laboratory) are listed below for reference.    Significant Diagnostic Studies: Dg Chest 2 View  03/22/2014   CLINICAL DATA:  Cough.  EXAM: CHEST  2 VIEW  COMPARISON:  01/16/2014.  FINDINGS: Mediastinum hilar structures normal. Heart size normal. Bilateral pulmonary interstitial prominence present. These findings suggest pneumonitis. Right middle lobe atelectatic changes and/or fissural pleural fluid noted. No pneumothorax. No acute bony abnormality.  IMPRESSION: 1. Bilateral pulmonary interstitial prominence suggesting pneumonitis . 2. Right middle lobe subsegmental atelectasis and/or fissural pleural fluid noted.   Electronically Signed   By: Marcello Moores   Register   On: 03/22/2014 08:33   Ct Head Wo Contrast  04/14/2014   CLINICAL DATA:  Confusion  EXAM: CT HEAD WITHOUT CONTRAST  TECHNIQUE: Contiguous axial images were obtained from the base of the skull through the vertex without intravenous contrast.  COMPARISON:  None.  FINDINGS: There is a degree of motion artifact making this study less than optimal. The ventricles are normal in size and configuration. There is no apparent mass, hemorrhage, extra-axial fluid collection, or midline shift. Gray-white compartments appear normal. No acute infarct apparent. Bony calvarium appears intact. The mastoid air cells are clear.  IMPRESSION: Study within  normal limits. No appreciable intracranial mass, hemorrhage, or focal gray -white compartment lesion.   Electronically Signed   By: Lowella Grip M.D.   On: 04/14/2014 18:33   Dg Chest Portable 1 View  04/14/2014   CLINICAL DATA:  Chest pain and difficulty breathing  EXAM: PORTABLE CHEST - 1 VIEW  COMPARISON:  March 21, 2014  FINDINGS: There is generalized interstitial prominence, probably reflecting chronic interstitial fibrosis. There is a degree of underlying emphysema. There is no airspace consolidation. The heart size is within normal limits. Pulmonary vascularity reflects underlying emphysema. No adenopathy. There is atherosclerotic change in the aorta.  IMPRESSION: Underlying emphysema with underlying interstitial pneumonitis. A mild degree of chronic congestive heart failure cannot be excluded radiographically, however. There is no airspace consolidation.   Electronically Signed   By: Lowella Grip M.D.   On: 04/14/2014 17:49    Microbiology: No results found for this or any previous visit (from the past 240 hour(s)).   Labs: Basic Metabolic Panel:  Recent Labs Lab 04/14/14 1703 04/15/14 0635  NA 141 140  K 3.4* 4.0  CL 99 99  CO2 28 28  GLUCOSE 91 133*  BUN 14 16  CREATININE 0.54 0.53  CALCIUM 8.7 9.1   Liver Function  Tests:  Recent Labs Lab 04/15/14 0635  AST 22  ALT 10  ALKPHOS 82  BILITOT <0.2*  PROT 7.4  ALBUMIN 3.4*   No results found for this basename: LIPASE, AMYLASE,  in the last 168 hours No results found for this basename: AMMONIA,  in the last 168 hours CBC:  Recent Labs Lab 04/14/14 1703 04/15/14 0635  WBC 12.1* 8.8  NEUTROABS 8.3*  --   HGB 12.2 12.3  HCT 37.5 37.8  MCV 90.6 90.4  PLT 303 313   Cardiac Enzymes:  Recent Labs Lab 04/14/14 1703  TROPONINI <0.30   BNP: BNP (last 3 results) No results found for this basename: PROBNP,  in the last 8760 hours CBG: No results found for this basename: GLUCAP,  in the last 168 hours     Signed:  Ly Bacchi  [facility] 04/17/2014, 8:48 PM

## 2014-04-17 NOTE — Discharge Instructions (Signed)

## 2014-04-17 NOTE — Progress Notes (Signed)
O2 Saturations:  Patient's O2 saturation is 90% at rest on room air.  Patient's O2 saturation is 87% with activity on room air.  Patient O2 saturation is 93% at rest with 3LNC.  Patient's O2 saturation is 90% with activity on 3LNC.

## 2014-04-17 NOTE — Progress Notes (Signed)
Unable to obtain contact information for [employer] to set up portable oxygen tank for patient to take home. Called main line for [employer] and not able to reach someone before tomorrow morning. Attempted to reach system-wide on call case manager, but unsuccessful. AC notified, and respiratory team gave approval for patient to take home O2 tank with the expectation that patient would bring tank back as soon as possible. Patient and family verbalized understanding. Patient's best contact number is her sister's phone [phone]) and alternate number [phone]). Patient was given O2 tank #25// [id number] Lot# [id number]. Will follow up with case manager to ensure patient obtains portable O2 tank for home O2 provider.

## 2014-04-17 NOTE — Progress Notes (Signed)
NURSING PROGRESS NOTE  Kristy Moore [id number] Discharge Data: 04/17/2014 7:32 PM Attending Provider: No att. providers found PJA:SNKNL, Elenore Rota, MD   [name] to be D/C'd Home per MD order.    All IV's will be discontinued and monitored for bleeding.  All belongings will be returned to patient for patient to take home.  AVS summary and prescriptions reviewed with patient and family.   Patient was provided O2 tank for transport home. See previous progress note.  Patient left floor via wheelchair, escorted by NT.  Last Documented Vital Signs:  Blood pressure 141/75, pulse 75, temperature 98.7 F (37.1 C), temperature source Oral, resp. rate 20, height 5\' 4"  (1.626 m), weight 53.661 kg (118 lb 4.8 oz), last menstrual period 09/21/2003, SpO2 93.00%.  [name] D

## 2014-04-26 ENCOUNTER — Telehealth: Payer: Self-pay | Admitting: Family Medicine

## 2014-04-26 NOTE — Telephone Encounter (Signed)
Patient is wearing oxygen 24hrs day and her oxygen level is dropping below 90 and having chest pain. Patient sister was advised to call 911 and and get straight to the hospital. Patient sister agrees and understands

## 2014-04-27 DIAGNOSIS — R079 Chest pain, unspecified: Secondary | ICD-10-CM | POA: Diagnosis not present

## 2014-04-27 DIAGNOSIS — Z9981 Dependence on supplemental oxygen: Secondary | ICD-10-CM | POA: Diagnosis not present

## 2014-04-27 DIAGNOSIS — R946 Abnormal results of thyroid function studies: Secondary | ICD-10-CM | POA: Diagnosis not present

## 2014-04-27 DIAGNOSIS — J690 Pneumonitis due to inhalation of food and vomit: Secondary | ICD-10-CM | POA: Diagnosis not present

## 2014-04-27 DIAGNOSIS — J159 Unspecified bacterial pneumonia: Secondary | ICD-10-CM | POA: Diagnosis not present

## 2014-04-27 DIAGNOSIS — M79609 Pain in unspecified limb: Secondary | ICD-10-CM | POA: Diagnosis present

## 2014-04-27 DIAGNOSIS — R059 Cough, unspecified: Secondary | ICD-10-CM | POA: Diagnosis not present

## 2014-04-27 DIAGNOSIS — E440 Moderate protein-calorie malnutrition: Secondary | ICD-10-CM | POA: Diagnosis present

## 2014-04-27 DIAGNOSIS — K219 Gastro-esophageal reflux disease without esophagitis: Secondary | ICD-10-CM | POA: Diagnosis present

## 2014-04-27 DIAGNOSIS — F3289 Other specified depressive episodes: Secondary | ICD-10-CM | POA: Diagnosis present

## 2014-04-27 DIAGNOSIS — R7981 Abnormal blood-gas level: Secondary | ICD-10-CM | POA: Diagnosis not present

## 2014-04-27 DIAGNOSIS — E876 Hypokalemia: Secondary | ICD-10-CM | POA: Diagnosis present

## 2014-04-27 DIAGNOSIS — K5900 Constipation, unspecified: Secondary | ICD-10-CM | POA: Diagnosis present

## 2014-04-27 DIAGNOSIS — Z8614 Personal history of Methicillin resistant Staphylococcus aureus infection: Secondary | ICD-10-CM | POA: Diagnosis not present

## 2014-04-27 DIAGNOSIS — R05 Cough: Secondary | ICD-10-CM | POA: Diagnosis not present

## 2014-04-27 DIAGNOSIS — M549 Dorsalgia, unspecified: Secondary | ICD-10-CM | POA: Diagnosis not present

## 2014-04-27 DIAGNOSIS — D473 Essential (hemorrhagic) thrombocythemia: Secondary | ICD-10-CM | POA: Diagnosis present

## 2014-04-27 DIAGNOSIS — F17200 Nicotine dependence, unspecified, uncomplicated: Secondary | ICD-10-CM | POA: Diagnosis not present

## 2014-04-27 DIAGNOSIS — J441 Chronic obstructive pulmonary disease with (acute) exacerbation: Secondary | ICD-10-CM | POA: Diagnosis not present

## 2014-04-27 DIAGNOSIS — R0902 Hypoxemia: Secondary | ICD-10-CM | POA: Diagnosis not present

## 2014-04-27 DIAGNOSIS — J9620 Acute and chronic respiratory failure, unspecified whether with hypoxia or hypercapnia: Secondary | ICD-10-CM | POA: Diagnosis present

## 2014-04-27 DIAGNOSIS — R062 Wheezing: Secondary | ICD-10-CM | POA: Diagnosis not present

## 2014-04-27 DIAGNOSIS — R634 Abnormal weight loss: Secondary | ICD-10-CM | POA: Diagnosis not present

## 2014-04-27 DIAGNOSIS — J189 Pneumonia, unspecified organism: Secondary | ICD-10-CM | POA: Diagnosis not present

## 2014-04-27 DIAGNOSIS — R1313 Dysphagia, pharyngeal phase: Secondary | ICD-10-CM | POA: Diagnosis present

## 2014-04-27 DIAGNOSIS — R918 Other nonspecific abnormal finding of lung field: Secondary | ICD-10-CM | POA: Diagnosis not present

## 2014-04-27 DIAGNOSIS — E0781 Sick-euthyroid syndrome: Secondary | ICD-10-CM | POA: Diagnosis present

## 2014-04-27 DIAGNOSIS — J449 Chronic obstructive pulmonary disease, unspecified: Secondary | ICD-10-CM | POA: Diagnosis not present

## 2014-04-27 DIAGNOSIS — IMO0002 Reserved for concepts with insufficient information to code with codable children: Secondary | ICD-10-CM | POA: Diagnosis not present

## 2014-04-27 DIAGNOSIS — K623 Rectal prolapse: Secondary | ICD-10-CM | POA: Diagnosis present

## 2014-04-27 DIAGNOSIS — R0602 Shortness of breath: Secondary | ICD-10-CM | POA: Diagnosis not present

## 2014-04-27 DIAGNOSIS — R0609 Other forms of dyspnea: Secondary | ICD-10-CM | POA: Diagnosis not present

## 2014-04-27 DIAGNOSIS — F329 Major depressive disorder, single episode, unspecified: Secondary | ICD-10-CM | POA: Diagnosis present

## 2014-04-27 DIAGNOSIS — Z8673 Personal history of transient ischemic attack (TIA), and cerebral infarction without residual deficits: Secondary | ICD-10-CM | POA: Diagnosis not present

## 2014-04-27 DIAGNOSIS — J13 Pneumonia due to Streptococcus pneumoniae: Secondary | ICD-10-CM | POA: Diagnosis not present

## 2014-05-02 ENCOUNTER — Telehealth: Payer: Self-pay | Admitting: Family Medicine

## 2014-05-02 ENCOUNTER — Ambulatory Visit: Payer: Medicare Other | Admitting: Family Medicine

## 2014-05-02 NOTE — Telephone Encounter (Signed)
appt given for thurs with [facility]

## 2014-05-05 ENCOUNTER — Telehealth: Payer: Self-pay | Admitting: Family Medicine

## 2014-05-05 ENCOUNTER — Ambulatory Visit (INDEPENDENT_AMBULATORY_CARE_PROVIDER_SITE_OTHER): Payer: Medicare Other | Admitting: Family Medicine

## 2014-05-05 ENCOUNTER — Encounter: Payer: Self-pay | Admitting: Family Medicine

## 2014-05-05 VITALS — BP 118/76 | HR 90 | Temp 96.7°F | Ht 64.0 in | Wt 117.0 lb

## 2014-05-05 DIAGNOSIS — G4700 Insomnia, unspecified: Secondary | ICD-10-CM

## 2014-05-05 DIAGNOSIS — J441 Chronic obstructive pulmonary disease with (acute) exacerbation: Secondary | ICD-10-CM | POA: Diagnosis not present

## 2014-05-05 DIAGNOSIS — J13 Pneumonia due to Streptococcus pneumoniae: Secondary | ICD-10-CM | POA: Diagnosis not present

## 2014-05-05 MED ORDER — NORTRIPTYLINE HCL 25 MG PO CAPS: 25.0000 mg | ORAL_CAPSULE | Freq: Every day | ORAL | Status: DC

## 2014-05-05 NOTE — Progress Notes (Signed)
   Subjective:    Patient ID: Kristy Moore, female    DOB: 1961/06/08, 53 y.o.   MRN: [id number]  HPI  This 53 y.o. female presents for evaluation of hospital follow up.  She was hospitalized for pneumonia from 04/27/14 to 05/01/14.  She was first admitted to [facility] 04/14/14 for Copd exacerbation.  She states she worsened and a friend took her to a hospital in [facility] clinic and there she was dx'd with streptococcal pneumoniae pnuemonia.    Review of Systems    No chest pain, SOB, HA, dizziness, vision change, N/V, diarrhea, constipation, dysuria, urinary urgency or frequency, myalgias, arthralgias or rash.  Objective:   Physical Exam Vital signs noted  Chronically ill appearing cachetic female with nasal cannula oxygen intact.  HEENT - Head atraumatic Normocephalic                Eyes - PERRLA, Conjuctiva - clear Sclera- Clear EOMI                Ears - EAC's Wnl TM's Wnl Gross Hearing WNL                 Throat - oropharanx wnl Respiratory - Lungs distatn Cardiac - RRR S1 and S2 without murmur GI - Abdomen soft Nontender and bowel sounds active x 4 Extremities - No edema. Neuro - Grossly intact.       Assessment & Plan:  Insomnia - Plan: Ambulatory referral to Pulmonology, nortriptyline (PAMELOR) 25 MG capsule  Pneumonia due to Streptococcus pneumoniae - Plan: Ambulatory referral to Pulmonology, nortriptyline (PAMELOR) 25 MG capsule  COPD exacerbation - Plan: Ambulatory referral to Pulmonology, nortriptyline (PAMELOR) 25 MG capsule  Follow up with pulmonary.  Lysbeth Penner FNP

## 2014-05-05 NOTE — Telephone Encounter (Signed)
Appointment put in

## 2014-05-06 ENCOUNTER — Telehealth: Payer: Self-pay | Admitting: Internal Medicine

## 2014-05-06 ENCOUNTER — Encounter (INDEPENDENT_AMBULATORY_CARE_PROVIDER_SITE_OTHER): Payer: Self-pay

## 2014-05-06 ENCOUNTER — Ambulatory Visit (INDEPENDENT_AMBULATORY_CARE_PROVIDER_SITE_OTHER): Payer: Medicare Other | Admitting: Emergency Medicine

## 2014-05-06 ENCOUNTER — Encounter: Payer: Self-pay | Admitting: Emergency Medicine

## 2014-05-06 VITALS — BP 132/80 | HR 89 | Ht 64.0 in | Wt 117.0 lb

## 2014-05-06 DIAGNOSIS — J4489 Other specified chronic obstructive pulmonary disease: Secondary | ICD-10-CM

## 2014-05-06 DIAGNOSIS — J449 Chronic obstructive pulmonary disease, unspecified: Secondary | ICD-10-CM | POA: Diagnosis not present

## 2014-05-06 NOTE — Patient Instructions (Signed)
Please complete the prednisone as directed Complete your antibiotics until all are gone.  Stay on spiriva daily Stop Advair for now. We will start Symbicort 2 puffs twice a day. Rinse your mouth and gargle after using.  Continue your oxygen on 3L/min at all times.  Follow with Dr Melvyn Novas as planned to decide whether the change to Symbicort has been helpful CONGRATULATIONS on stopping smoking. Do not restart.

## 2014-05-06 NOTE — Progress Notes (Signed)
Subjective:    Patient ID: Kristy Moore, female    DOB: 02/03/1961   MRN: [id number]  HPI  47 yowf active smoker doing ok until 2002 collapsed R lung requiring VATs in [place] and did ok   on advair then downhill p back surgery in [facility] around 4765 complicated by osteo removed the metal and did poorly and bad to worse starting early July 2015 > admit [place] x sev days dx pneumonia and referred to pulmonary clinic by [facility]   1st [facility] office visit/ [place] / still smoking/ poor hfa   Chief Complaint  Patient presents with  . Acute Visit    MW pt. Pt has appt with MW on 9/29. Pt recently was admitted to [facility] for emphysema. Pt c/o cough with trouble bringing mucus up. Pt c/o increase SOB and pain at epigastric area.   w/c bound chronically due to back pain. Hacking cough but minimal mucus and not purulent- saba neb helps more than anything, advair makes her cough worse and not helping her breathing. The areas where she has cp are L> R ant > post but diffuse, have been present for years always brought on by cough, none are new location, all improve on tramadol.   No obvious other patterns in day to day or daytime variabilty or assoc   cp or chest tightness, subjective wheeze or overt sinus  symptoms. No unusual exp hx or h/o childhood pna/ asthma or knowledge of premature birth.  Sleeping ok without nocturnal  or early am exacerbation  of respiratory  c/o's or need for noct saba. Also denies any obvious fluctuation of symptoms with weather or environmental changes or other aggravating or alleviating factors except as outlined above   Current Medications, Allergies, Complete Past Medical History, Past Surgical History, Family History, and Social History were reviewed in [employer] record.  Acute OV 05/06/14 -- hx tobacco and COPD, followed by Dr Melvyn Novas. She was just admitted to [facility] late August and then in [place] with weakness, hypoxemia. She was  also having chest and flank pain, cough non-productive. She was treated with abx and pred, which helped her temporarily.  She has been maintained on Advair + Spiriva.  She stopped smoking 3 weeks ago!         Review of Systems  Constitutional: Positive for unexpected weight change. Negative for fever and chills.  HENT: Positive for congestion, dental problem and trouble swallowing. Negative for ear pain, nosebleeds, postnasal drip, rhinorrhea, sinus pressure, sneezing, sore throat and voice change.   Eyes: Negative for visual disturbance.  Respiratory: Positive for cough and shortness of breath. Negative for choking.   Cardiovascular: Positive for chest pain. Negative for leg swelling.  Gastrointestinal: Positive for abdominal pain. Negative for vomiting and diarrhea.  Genitourinary: Negative for difficulty urinating.       Acid heartburn  Indigestion  Musculoskeletal: Positive for arthralgias.  Skin: Negative for rash.  Neurological: Negative for tremors, syncope and headaches.  Hematological: Does not bruise/bleed easily.       Objective:   Physical Exam Filed Vitals:   05/06/14 1402  BP: 132/80  Pulse: 89  Height: 5\' 4"  (1.626 m)  Weight: 117 lb (53.071 kg)  SpO2: 95%   Gen: Pleasant, well-nourished, in no distress,  normal affect  ENT: No lesions,  mouth clear,  oropharynx clear, no postnasal drip  Neck: No JVD, no TMG, no carotid bruits  Lungs: No use of accessory muscles, distant, clear  without rales or rhonchi, no wheeze even on forced exp  Cardiovascular: RRR, heart sounds normal, no murmur or gallops, no peripheral edema  Musculoskeletal: No deformities, no cyanosis or clubbing  Neuro: alert, non focal  Skin: Warm, no lesions or rashes      Assessment & Plan:  COPD (chronic obstructive pulmonary disease) Frequent exacerbations in pt that only recently stopped smoking. She is improved, still has dry cough. Would like to change Advair to alternative to see if  cough improves. She will report back to Dr Melvyn Novas, make permanent change if she is better.   Please complete the prednisone as directed Complete your antibiotics until all are gone.  Stay on spiriva daily Stop Advair for now. We will start Symbicort 2 puffs twice a day. Rinse your mouth and gargle after using.  Continue your oxygen on 3L/min at all times.  Follow with Dr Melvyn Novas as planned to decide whether the change to Symbicort has been helpful CONGRATULATIONS on stopping smoking. Do not restart.

## 2014-05-06 NOTE — Assessment & Plan Note (Signed)
Frequent exacerbations in pt that only recently stopped smoking. She is improved, still has dry cough. Would like to change Advair to alternative to see if cough improves. She will report back to Dr Melvyn Novas, make permanent change if she is better.   Please complete the prednisone as directed Complete your antibiotics until all are gone.  Stay on spiriva daily Stop Advair for now. We will start Symbicort 2 puffs twice a day. Rinse your mouth and gargle after using.  Continue your oxygen on 3L/min at all times.  Follow with Dr Melvyn Novas as planned to decide whether the change to Symbicort has been helpful CONGRATULATIONS on stopping smoking. Do not restart.

## 2014-05-06 NOTE — Telephone Encounter (Signed)
Spoke with the pt's sister  She states that since last visit "she has been in and out of the hospital" She is having increased cough and SOB Ov with RB at 1:45 today

## 2014-05-17 ENCOUNTER — Ambulatory Visit: Payer: Medicare Other | Admitting: Internal Medicine

## 2014-05-18 ENCOUNTER — Other Ambulatory Visit: Payer: Self-pay | Admitting: Family Medicine

## 2014-05-24 ENCOUNTER — Ambulatory Visit: Payer: Medicare Other | Admitting: Internal Medicine

## 2014-05-30 ENCOUNTER — Ambulatory Visit: Payer: Self-pay | Admitting: Family Medicine

## 2014-05-30 ENCOUNTER — Telehealth: Payer: Self-pay | Admitting: Family Medicine

## 2014-05-30 NOTE — Telephone Encounter (Signed)
appt given for Thursday per patient request

## 2014-06-02 ENCOUNTER — Ambulatory Visit (INDEPENDENT_AMBULATORY_CARE_PROVIDER_SITE_OTHER): Payer: Medicare Other | Admitting: Family Medicine

## 2014-06-02 VITALS — BP 122/80 | HR 79 | Temp 97.2°F | Ht 64.0 in | Wt 126.4 lb

## 2014-06-02 DIAGNOSIS — R42 Dizziness and giddiness: Secondary | ICD-10-CM | POA: Diagnosis not present

## 2014-06-02 DIAGNOSIS — Z23 Encounter for immunization: Secondary | ICD-10-CM | POA: Diagnosis not present

## 2014-06-02 DIAGNOSIS — E878 Other disorders of electrolyte and fluid balance, not elsewhere classified: Secondary | ICD-10-CM

## 2014-06-02 DIAGNOSIS — R259 Unspecified abnormal involuntary movements: Secondary | ICD-10-CM

## 2014-06-02 DIAGNOSIS — R251 Tremor, unspecified: Secondary | ICD-10-CM | POA: Diagnosis not present

## 2014-06-02 DIAGNOSIS — W19XXXA Unspecified fall, initial encounter: Secondary | ICD-10-CM

## 2014-06-02 DIAGNOSIS — G894 Chronic pain syndrome: Secondary | ICD-10-CM | POA: Diagnosis not present

## 2014-06-02 MED ORDER — TRAMADOL HCL 50 MG PO TABS: 100.0000 mg | ORAL_TABLET | Freq: Three times a day (TID) | ORAL | Status: DC

## 2014-06-02 NOTE — Progress Notes (Signed)
   Subjective:    Patient ID: Kristy Moore, female    DOB: [birth year], 53 y.o.   MRN: [id number]  HPI This 53 y.o. female presents for evaluation of routine follow up.  She has seen pulmonary and she has quit smoking and is feeling better.  She states she is feeling better.  She states she is having more and more tremors in her hands and she is having difficulty with balance.  She states she loses grip on cups and throws them with involuntary movements of her arms and she states her arms and legs have involuntary movements and jerky movements.  She is c/o falling one time from her jerks and imbalance which she states is worse.  She feels a lot better since quitting smoking and she is not SOB or having any exacerbations of COPD   Review of Systems C/o jerky limb movements   No chest pain, SOB, HA, dizziness, vision change, N/V, diarrhea, constipation, dysuria, urinary urgency or frequency, myalgias, arthralgias or rash.  Objective:   Physical Exam  Vital signs noted  Well developed well nourished female.  HEENT - Head atraumatic Normocephalic                Eyes - PERRLA, Conjuctiva - clear Sclera- Clear EOMI                Ears - EAC's Wnl TM's Wnl Gross Hearing WNL                Nose - Nares patent                 Throat - oropharanx wnl Respiratory - Lungs CTA bilateral Cardiac - RRR S1 and S2 without murmur GI - Abdomen soft Nontender and bowel sounds active x 4 Extremities - No edema. Neuro - Grossly intact.      Assessment & Plan:  Chronic pain syndrome - Plan: traMADol (ULTRAM) 50 MG tablet  Tremors of nervous system  Disequilibrium syndrome - Plan: CT Head W Contrast, Ambulatory referral to Neurology  Falls, initial encounter - Plan: CT Head W Contrast, Ambulatory referral to Neurology  Involuntary movements - Plan: CT Head W Contrast, Ambulatory referral to Neurology  Lysbeth Penner FNP

## 2014-06-03 ENCOUNTER — Ambulatory Visit: Payer: Medicare Other | Admitting: Internal Medicine

## 2014-06-07 ENCOUNTER — Ambulatory Visit (HOSPITAL_COMMUNITY): Admission: RE | Admit: 2014-06-07 | Payer: Medicare Other | Source: Ambulatory Visit

## 2014-06-14 ENCOUNTER — Ambulatory Visit (HOSPITAL_COMMUNITY)
Admission: RE | Admit: 2014-06-14 | Discharge: 2014-06-14 | Disposition: A | Discharge status: Home / Self Care | Payer: Medicare Other | Source: Ambulatory Visit | Attending: Family Medicine | Admitting: Family Medicine

## 2014-06-14 DIAGNOSIS — Z9181 History of falling: Secondary | ICD-10-CM | POA: Insufficient documentation

## 2014-06-14 DIAGNOSIS — R259 Unspecified abnormal involuntary movements: Secondary | ICD-10-CM

## 2014-06-14 DIAGNOSIS — E878 Other disorders of electrolyte and fluid balance, not elsewhere classified: Secondary | ICD-10-CM

## 2014-06-14 DIAGNOSIS — J449 Chronic obstructive pulmonary disease, unspecified: Secondary | ICD-10-CM | POA: Diagnosis not present

## 2014-06-14 DIAGNOSIS — W19XXXA Unspecified fall, initial encounter: Secondary | ICD-10-CM

## 2014-06-14 DIAGNOSIS — Z87891 Personal history of nicotine dependence: Secondary | ICD-10-CM | POA: Diagnosis not present

## 2014-06-14 DIAGNOSIS — R258 Other abnormal involuntary movements: Secondary | ICD-10-CM | POA: Insufficient documentation

## 2014-06-14 DIAGNOSIS — S0990XA Unspecified injury of head, initial encounter: Secondary | ICD-10-CM | POA: Diagnosis not present

## 2014-06-14 MED ORDER — IOHEXOL 300 MG/ML  SOLN
75.0000 mL | Freq: Once | INTRAMUSCULAR | Status: AC | PRN
  Administered 2014-06-14: 75 mL via INTRAVENOUS

## 2014-06-16 ENCOUNTER — Telehealth: Payer: Self-pay

## 2014-06-16 NOTE — Telephone Encounter (Signed)
[facility] to X-ray 

## 2014-06-17 ENCOUNTER — Ambulatory Visit (INDEPENDENT_AMBULATORY_CARE_PROVIDER_SITE_OTHER): Payer: Medicare Other | Admitting: Neurology

## 2014-06-17 ENCOUNTER — Encounter: Payer: Self-pay | Admitting: Neurology

## 2014-06-17 VITALS — BP 130/64 | HR 84 | Ht 62.0 in | Wt 125.0 lb

## 2014-06-17 DIAGNOSIS — G9340 Encephalopathy, unspecified: Secondary | ICD-10-CM

## 2014-06-17 DIAGNOSIS — R410 Disorientation, unspecified: Secondary | ICD-10-CM | POA: Diagnosis not present

## 2014-06-17 DIAGNOSIS — R278 Other lack of coordination: Secondary | ICD-10-CM

## 2014-06-17 DIAGNOSIS — R413 Other amnesia: Secondary | ICD-10-CM | POA: Diagnosis not present

## 2014-06-17 DIAGNOSIS — M545 Low back pain: Secondary | ICD-10-CM

## 2014-06-17 DIAGNOSIS — R279 Unspecified lack of coordination: Secondary | ICD-10-CM

## 2014-06-17 DIAGNOSIS — G253 Myoclonus: Secondary | ICD-10-CM | POA: Diagnosis not present

## 2014-06-17 DIAGNOSIS — G8929 Other chronic pain: Secondary | ICD-10-CM

## 2014-06-17 NOTE — Progress Notes (Signed)
Subjective:   Kristy Moore was seen in consultation in the movement disorder clinic at the request of Lysbeth Penner, FNP.  The evaluation is for tremor and balance changes and "throwing objects."    Pt is accompanied by her sister who supplements the history.  Pt reports that she has had sx's for over a year.  She states that her arm will just "jerk" and she may throw a glass without meaning to.  She states that it happens daily.  It is usually the R hand.  Her sister states that while the throwing of objects has changed some, the confusion has been equally as bothersome.  Memory change has been going on for a few months.  She has trouble remembering what happened what happened yesterday.  She has been driving on a limited basis now, relying mostly on her sister.  Her sister thought that maybe it was related to O2 change from her COPD but relates that has been better lately.  Tremor and memory change may get worse with stress.  Pt lives with her sister and brother and the brother has schizophrenia and MR and there is a lot of stress in the home.  There is no one else in family with abnormal movements.    Is taking 100 mg of tramadol tid and that dose was just increased but has been on tramadol for a very long time.  She was on other narcotic opioid-based medications, which she was attempting to get off of.  Affected by caffeine:  Unknown but doesn't think so  (drinks tea "all day long) Affected by alcohol:  Doesn't drink Affected by stress:  Yes.   Affected by fatigue:  Yes.   Spills soup if on spoon:  Yes.   Spills glass of liquid if full:  Yes.   Affects ADL's (tying shoes, brushing teeth, etc):  No.  Current/Previously tried tremor medications: Not applicable  Current medications that may exacerbate tremor:  Albuterol (not used lately); symbicort bid  Outside reports reviewed: historical medical records, lab reports and referral letter/letters.  Allergies  Allergen Reactions  .  Hydrocodone Itching    Outpatient Encounter Prescriptions as of 06/17/2014  Medication Sig  . albuterol (PROAIR HFA) 108 (90 BASE) MCG/ACT inhaler Inhale 2 puffs into the lungs every 6 (six) hours as needed for wheezing or shortness of breath.  Marland Kitchen albuterol (PROVENTIL) (2.5 MG/3ML) 0.083% nebulizer solution Take 27ml nebulizer treatment with ipratropium 4 times a days as needed  . amitriptyline (ELAVIL) 25 MG tablet Take 1 tablet (25 mg total) by mouth at bedtime.  . budesonide-formoterol (SYMBICORT) 160-4.5 MCG/ACT inhaler Inhale 2 puffs into the lungs 2 (two) times daily.  . cetirizine (ZYRTEC) 10 MG tablet Take by mouth.  . citalopram (CELEXA) 40 MG tablet Take 1 tablet (40 mg total) by mouth daily.  Marland Kitchen guaiFENesin (MUCINEX) 600 MG 12 hr tablet Take 2 tablets (1,200 mg total) by mouth 2 (two) times daily.  . nortriptyline (PAMELOR) 25 MG capsule Take 1 capsule (25 mg total) by mouth at bedtime.  Marland Kitchen omeprazole (PRILOSEC) 20 MG capsule Take 1 capsule (20 mg total) by mouth daily.  Marland Kitchen tiotropium (SPIRIVA) 18 MCG inhalation capsule Place 1 capsule (18 mcg total) into inhaler and inhale daily.  . traMADol (ULTRAM) 50 MG tablet Take 2 tablets (100 mg total) by mouth 3 (three) times daily.  . [DISCONTINUED] nicotine (NICODERM CQ - DOSED IN MG/24 HOURS) 21 mg/24hr patch Place 1 patch (21 mg total) onto the  skin daily.  . [DISCONTINUED] Oxycodone HCl 10 MG TABS     Past Medical History  Diagnosis Date  . COPD (chronic obstructive pulmonary disease)   . Back pain   . Depression   . GERD (gastroesophageal reflux disease)     Past Surgical History  Procedure Laterality Date  . Spine surgery    . Tonsillectomy Bilateral   . Chest tube insertion  2002  . Lung surgery  2002  . Lymph node biopsy      History   Social History  . Marital Status: Legally Separated    Spouse Name: N/A    Number of Children: N/A  . Years of Education: N/A   Occupational History  . Not on file.   Social  History Main Topics  . Smoking status: Former Smoker -- 1.00 packs/day for 36 years    Types: Cigarettes    Quit date: 04/05/2014  . Smokeless tobacco: Current User     Comment: e-cig  . Alcohol Use: No  . Drug Use: No  . Sexual Activity: Not on file   Other Topics Concern  . Not on file   Social History Narrative  . No narrative on file    Family Status  Relation Status Death Age  . Mother Deceased     heart disease  . Father Deceased     murdered  . Sister Alive     healthy  . Brother Alive     diabetes, pacemaker  . Brother Alive     healthy  . Sister Alive     healthy  . Son Alive     healthy  . Son Alive     healthy  . Daughter Alive     healthy  . Daughter Alive     healthy    Review of Systems Chronic LBP.  A complete 10 system ROS was obtained and was negative apart from what is mentioned.   Objective:   VITALS:   Filed Vitals:   06/17/14 1431  BP: 130/64  Pulse: 84  Height: 5\' 2"  (1.575 m)  Weight: 125 lb (56.7 kg)   Wt Readings from Last 3 Encounters:  06/17/14 125 lb (56.7 kg)  06/02/14 126 lb 6.4 oz (57.335 kg)  05/06/14 117 lb (53.071 kg)      Gen:  Appears stated age and in NAD. HEENT:  Normocephalic, atraumatic. The mucous membranes are very dry. The superficial temporal arteries are without ropiness or tenderness. Cardiovascular: Regular rate and rhythm. Lungs: Clear to auscultation bilaterally. Neck: There are no carotid bruits noted bilaterally.  The patient was placed into  examining shorts.  NEUROLOGICAL:  Orientation:  The patient is alert and oriented x 3.  Recent and remote memory are intact.  Attention span and concentration are normal.  Able to name objects and repeat without trouble.  Fund of knowledge is appropriate Cranial nerves: There is good facial symmetry. The pupils are equal round and reactive to light bilaterally. Fundoscopic exam reveals clear disc margins bilaterally. Extraocular muscles are intact and  visual fields are full to confrontational testing. Speech is fluent and clear. Soft palate rises symmetrically and there is no tongue deviation. Hearing is intact to conversational tone. Tone: Tone is good throughout. Sensation: Sensation is intact to light touch and pinprick throughout (facial, trunk, extremities). Vibration is intact at the bilateral big toe. There is no extinction with double simultaneous stimulation. There is no sensory dermatomal level identified. Coordination:  The patient has no dysdiadichokinesia  or dysmetria. Motor: Strength is 5/5 in the bilateral upper and lower extremities.  Shoulder shrug is equal bilaterally.  There is no pronator drift.  There are no fasciculations noted. DTR's: Deep tendon reflexes are 2+-3/4 at the bilateral biceps, triceps, brachioradialis, patella and achilles.  Plantar responses are downgoing bilaterally. Gait and Station: The patient is is mildly unsteady with ambulation, but stride length is normal.  MOVEMENT EXAM: Tremor:  There is no tremor noted.  However, there is polymini-myoclonus in the distal fingers and toes most prominent and some minor myoclonus more proximally in larger m. Groups of shoulders and quadriceps.     Assessment/Plan:   1.  Polyminimyoclonus  -Etiology uncertain at this point.  Need to do labs including CBC, chemistry, TSH, B12, folate, heavy metal screen, HIV, urine drug screen, RPR  -I do wonder if Ultram could be the source, but I know that she is using this for pain control and that it has been a big deal to get her off of opioid medications.  Nonetheless, we may need to look at this medication more closely if no other etiology is found.  -MRI of the brain will be performed.  CT that was recently done was unremarkable and reviewed today.  -Given complaints of memory change, we will do an EEG as well.  Because of that, we also may need to do an LP in the future.  -Strongly believe that this is a toxic/metabolic  etiology but other things are in the differential including MSA but saw almost no evidence of MSA on examination.  -saw pt on both nortriptyline and amitriptyline.  Told her to contact her primary care physician and ask which she should be on, as she should not be on both. 2.  followup with me will be after the above has been completed.  Time in the room with me today was 60 minutes, with greater than 50% in counseling and coordinating care.  High level of complexity.

## 2014-06-17 NOTE — Patient Instructions (Addendum)
1. Contact PCP about stopping either Nortriptyline or Amitriptyline. You do not need to be on both medications.  2. We have scheduled you at [facility] for your MRI on Tuesday 06/21/14 at 3:45 pm. If you need to reschedule for any reason please call [phone]. 3. Your provider has requested that you have labwork completed today. Please go to [facility] on the first floor of this building before leaving the office today. 4. We will call you with an appt for EEG.  5. Follow up after testing complete.

## 2014-06-18 LAB — COMPREHENSIVE METABOLIC PANEL
ALT: 11 U/L (ref 0–35)
AST: 23 U/L (ref 0–37)
Albumin: 3.9 g/dL (ref 3.5–5.2)
Alkaline Phosphatase: 77 U/L (ref 39–117)
BILIRUBIN TOTAL: 0.1 mg/dL — AB (ref 0.2–1.2)
BUN: 16 mg/dL (ref 6–23)
CO2: 29 mEq/L (ref 19–32)
CREATININE: 0.54 mg/dL (ref 0.50–1.10)
Calcium: 8.6 mg/dL (ref 8.4–10.5)
Chloride: 102 mEq/L (ref 96–112)
GLUCOSE: 60 mg/dL — AB (ref 70–99)
Potassium: 4.5 mEq/L (ref 3.5–5.3)
Sodium: 141 mEq/L (ref 135–145)
Total Protein: 6.4 g/dL (ref 6.0–8.3)

## 2014-06-18 LAB — CBC WITH DIFFERENTIAL/PLATELET
BASOS ABS: 0.1 10*3/uL (ref 0.0–0.1)
BASOS PCT: 1 % (ref 0–1)
EOS ABS: 0.7 10*3/uL (ref 0.0–0.7)
Eosinophils Relative: 8 % — ABNORMAL HIGH (ref 0–5)
HEMATOCRIT: 37.1 % (ref 36.0–46.0)
Hemoglobin: 12.1 g/dL (ref 12.0–15.0)
Lymphocytes Relative: 22 % (ref 12–46)
Lymphs Abs: 1.9 10*3/uL (ref 0.7–4.0)
MCH: 28 pg (ref 26.0–34.0)
MCHC: 32.6 g/dL (ref 30.0–36.0)
MCV: 85.9 fL (ref 78.0–100.0)
MONOS PCT: 9 % (ref 3–12)
Monocytes Absolute: 0.8 10*3/uL (ref 0.1–1.0)
Neutro Abs: 5.1 10*3/uL (ref 1.7–7.7)
Neutrophils Relative %: 60 % (ref 43–77)
Platelets: 274 10*3/uL (ref 150–400)
RBC: 4.32 MIL/uL (ref 3.87–5.11)
RDW: 17.3 % — ABNORMAL HIGH (ref 11.5–15.5)
WBC: 8.5 10*3/uL (ref 4.0–10.5)

## 2014-06-18 LAB — DRUG SCREEN, URINE
Amphetamine Screen, Ur: NEGATIVE
Barbiturate Quant, Ur: NEGATIVE
Benzodiazepines.: NEGATIVE
CREATININE, U: 158.07 mg/dL
Cocaine Metabolites: NEGATIVE
Marijuana Metabolite: NEGATIVE
Methadone: NEGATIVE
OPIATES: NEGATIVE
PROPOXYPHENE: NEGATIVE
Phencyclidine (PCP): NEGATIVE

## 2014-06-18 LAB — RPR

## 2014-06-18 LAB — HIV ANTIBODY (ROUTINE TESTING W REFLEX): HIV: NONREACTIVE

## 2014-06-18 LAB — FOLATE: FOLATE: 17.2 ng/mL

## 2014-06-18 LAB — VITAMIN B12: VITAMIN B 12: 871 pg/mL (ref 211–911)

## 2014-06-18 LAB — TSH: TSH: 0.581 u[IU]/mL (ref 0.350–4.500)

## 2014-06-19 LAB — HEAVY METALS, BLOOD
Arsenic: <3 mcg/L (ref <23)
Lead: <2 ug/dL (ref <10)

## 2014-06-20 ENCOUNTER — Ambulatory Visit: Payer: Medicare Other | Admitting: Adult Health

## 2014-06-21 ENCOUNTER — Ambulatory Visit (HOSPITAL_COMMUNITY)
Admission: RE | Admit: 2014-06-21 | Discharge: 2014-06-21 | Disposition: A | Discharge status: Home / Self Care | Payer: Medicare Other | Source: Ambulatory Visit | Attending: Neurology | Admitting: Neurology

## 2014-06-21 DIAGNOSIS — G253 Myoclonus: Secondary | ICD-10-CM

## 2014-06-21 DIAGNOSIS — R42 Dizziness and giddiness: Secondary | ICD-10-CM | POA: Diagnosis not present

## 2014-06-21 DIAGNOSIS — Z9181 History of falling: Secondary | ICD-10-CM | POA: Diagnosis not present

## 2014-06-21 DIAGNOSIS — R410 Disorientation, unspecified: Secondary | ICD-10-CM

## 2014-06-21 DIAGNOSIS — R251 Tremor, unspecified: Secondary | ICD-10-CM | POA: Insufficient documentation

## 2014-06-21 DIAGNOSIS — G9340 Encephalopathy, unspecified: Secondary | ICD-10-CM

## 2014-06-21 DIAGNOSIS — R258 Other abnormal involuntary movements: Secondary | ICD-10-CM | POA: Insufficient documentation

## 2014-06-21 DIAGNOSIS — R278 Other lack of coordination: Secondary | ICD-10-CM

## 2014-06-21 DIAGNOSIS — R413 Other amnesia: Secondary | ICD-10-CM

## 2014-06-21 DIAGNOSIS — E878 Other disorders of electrolyte and fluid balance, not elsewhere classified: Secondary | ICD-10-CM | POA: Diagnosis not present

## 2014-06-21 MED ORDER — GADOBENATE DIMEGLUMINE 529 MG/ML IV SOLN
10.0000 mL | Freq: Once | INTRAVENOUS | Status: AC | PRN
  Administered 2014-06-21: 10 mL via INTRAVENOUS

## 2014-06-22 ENCOUNTER — Telehealth: Payer: Self-pay | Admitting: Neurology

## 2014-06-22 NOTE — Telephone Encounter (Signed)
Left message on machine for patient to call back.

## 2014-06-22 NOTE — Telephone Encounter (Signed)
Patient returning your call.

## 2014-06-22 NOTE — Telephone Encounter (Signed)
-----   Message from Clayton, DO sent at 06/22/2014  8:11 AM EST ----- Poor quality images with motion aritifact.  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  Luvenia Starch, you can tell pt that MRI didn't show anything unexpected (she knew to expect small vessel disease as we talked about that)

## 2014-06-22 NOTE — Telephone Encounter (Signed)
Spoke with patient and made her aware that MR did not show anything unexpected.

## 2014-06-23 NOTE — Telephone Encounter (Signed)
Sister aware of CT results

## 2014-06-27 ENCOUNTER — Encounter: Payer: Self-pay | Admitting: Adult Health

## 2014-06-27 ENCOUNTER — Telehealth: Payer: Self-pay | Admitting: Family Medicine

## 2014-06-27 ENCOUNTER — Ambulatory Visit (INDEPENDENT_AMBULATORY_CARE_PROVIDER_SITE_OTHER): Payer: Medicare Other | Admitting: Adult Health

## 2014-06-27 VITALS — BP 116/72 | HR 95 | Temp 97.7°F | Ht 64.0 in | Wt 130.6 lb

## 2014-06-27 DIAGNOSIS — J449 Chronic obstructive pulmonary disease, unspecified: Secondary | ICD-10-CM | POA: Diagnosis not present

## 2014-06-27 MED ORDER — BUDESONIDE-FORMOTEROL FUMARATE 160-4.5 MCG/ACT IN AERO: 2.0000 | INHALATION_SPRAY | Freq: Two times a day (BID) | RESPIRATORY_TRACT | Status: DC

## 2014-06-27 NOTE — Assessment & Plan Note (Addendum)
Flare off the Symbicort Need PFTs on return   Plan  Prescription sent for Symbicort to her pharmacy Continue on Symbicort and Spiriva  Keep up good job with not smoking  follow up Dr. Melvyn Novas  In 3-4 months and As needed

## 2014-06-27 NOTE — Progress Notes (Signed)
Subjective:    Patient ID: Kristy Moore, female    DOB: 1961-04-10   MRN: [id number]  HPI  59 yowf active smoker doing ok until 2002 collapsed R lung requiring VATs in [place] and did ok   on advair then downhill p back surgery in [facility] around 6599 complicated by osteo removed the metal and did poorly and bad to worse starting early July 2015 > admit [facility] x sev days dx pneumonia and referred 03/21/2014 to pulmonary clinic by [facility]   03/21/2014 1st [facility] office visit/ [facility] / still smoking/ poor hfa   Chief Complaint  Patient presents with  . Pulmonary Consult    Referred per Dr. Stevan Born. Pt c/o cough, CP and dyspnea for the past 3 wks. She states that she gets SOB with "any exertion" and sometimes SOB just at rest.  She states that CP is sharp and dull and "feels like a knife".    w/c bound chronically due to back pain. Hacking cough but minimal mucus and not purulent- saba neb helps more than anything, advair makes her cough worse and not helping her breathing. The areas where she has cp are L> R ant > post but diffuse, have been present for years always brought on by cough, none are new location, all improve on tramadol.  >>d/c adviar and rx Symbicort   06/27/2014 Follow up COPD  Patient returns for a follow-up. Patient reports that her Symbicort worked well for her but has been out of this x2 weeks.   Reports increased SOB and cough occasionally producing yellow mucus since she ran out of the medication. She denies any fever, chest pain, orthopnea, PND, or leg swelling. Would like a prescription sent to the pharmacy for Symbicort Feels that when she takes Symbicort and Spiriva at that her breathing is much improved with decreased cough and shortness of breath     Current Medications, Allergies, Complete Past Medical History, Past Surgical History, Family History, and Social History were reviewed in [employer] record.              Review of Systems  Constitutional:   No  weight loss, night sweats,  Fevers, chills,  +fatigue, or  lassitude.  HEENT:   No headaches,  Difficulty swallowing,  Tooth/dental problems, or  Sore throat,                No sneezing, itching, ear ache, nasal congestion, post nasal drip,   CV:  No chest pain,  Orthopnea, PND, swelling in lower extremities, anasarca, dizziness, palpitations, syncope.   GI  No heartburn, indigestion, abdominal pain, nausea, vomiting, diarrhea, change in bowel habits, loss of appetite, bloody stools.   Resp: No chest wall deformity  Skin: no rash or lesions.  GU: no dysuria, change in color of urine, no urgency or frequency.  No flank pain, no hematuria   MS:  No joint pain or swelling.  No decreased range of motion.  No back pain.  Psych:  No change in mood or affect. No depression or anxiety.  No memory loss.           Objective:   Physical Exam  W/c bound wf     HEENT mild turbinate edema.  Oropharynx no thrush or excess pnd or cobblestoning.  No JVD or cervical adenopathy. Mild accessory muscle hypertrophy. Trachea midline, nl thryroid. Chest was hyperinflated by percussion with diminished breath sounds and moderate increased exp time without wheeze. Melanee Left  sign positive at mid inspiration. Regular rate and rhythm without murmur gallop or rub or increase P2 or edema.  Abd: no hsm, nl excursion. Ext warm without cyanosis or clubbing.      CXR  03/21/2014 :  Variable chronic ssatx rml since 12/20/13     Assessment & Plan:

## 2014-06-27 NOTE — Patient Instructions (Signed)
Continue on Symbicort and Spiriva  Keep up good job with not smoking  follow up Dr. Melvyn Novas  In 3-4 months and As needed

## 2014-06-30 NOTE — Telephone Encounter (Signed)
Form ready , pt aware

## 2014-07-04 ENCOUNTER — Ambulatory Visit (HOSPITAL_COMMUNITY): Admission: RE | Admit: 2014-07-04 | Payer: Medicare Other | Source: Ambulatory Visit

## 2014-07-04 ENCOUNTER — Telehealth: Payer: Self-pay | Admitting: Neurology

## 2014-07-04 ENCOUNTER — Telehealth: Payer: Self-pay | Admitting: Family Medicine

## 2014-07-04 NOTE — Telephone Encounter (Signed)
Patient is being cared for in [place] and would like to have her EEG done in [place], [place] due to proximity. I told her I will pass this information to Dr. Carles Collet, I am not sure if they do EEGs there. I told her her nurse will call with information once Dr. Carles Collet advises.

## 2014-07-04 NOTE — Telephone Encounter (Signed)
Pt given appt tomorrow with bill oxford tomorrow @ 4

## 2014-07-05 ENCOUNTER — Ambulatory Visit: Payer: Medicare Other | Admitting: Family Medicine

## 2014-07-05 ENCOUNTER — Telehealth: Payer: Self-pay | Admitting: Neurology

## 2014-07-05 ENCOUNTER — Encounter: Payer: Self-pay | Admitting: *Deleted

## 2014-07-05 ENCOUNTER — Encounter (INDEPENDENT_AMBULATORY_CARE_PROVIDER_SITE_OTHER): Payer: Self-pay

## 2014-07-05 NOTE — Telephone Encounter (Signed)
-----   Message from Liberty, DO sent at 07/04/2014  6:16 PM EST ----- Can we schedule at [facility] with Dr. Delice Lesch to read? ----- Message -----    From: Azalee Course    Sent: 07/04/2014   2:25 PM      To: Eustace Quail Tat, DO  Upon confirming her EEG appt Wednesday at 10:30 she said she is staying in [place] and wanted to know if she can have her test done in [place] it is closer for her caretaker to get her there. I told her I would pass this on to you and that your nurse would let her know. I do not know if they do EEG's there or not.   Laureen Ochs

## 2014-07-05 NOTE — Telephone Encounter (Signed)
LMOM with respiratory therapy at [facility] to schedule patient for EEG. They state this is who you have to talk to to schedule. Awaiting call back.

## 2014-07-05 NOTE — Telephone Encounter (Signed)
EEG scheduled at [facility] on 07/12/2014 at 2:00 pm. Patient made aware.

## 2014-07-06 ENCOUNTER — Other Ambulatory Visit: Payer: Medicare Other

## 2014-07-12 ENCOUNTER — Ambulatory Visit (HOSPITAL_COMMUNITY): Payer: Medicare Other

## 2014-07-19 ENCOUNTER — Ambulatory Visit (HOSPITAL_COMMUNITY)
Admission: RE | Admit: 2014-07-19 | Discharge: 2014-07-19 | Disposition: A | Discharge status: Home / Self Care | Payer: Medicare Other | Source: Ambulatory Visit | Attending: Neurology | Admitting: Neurology

## 2014-07-19 DIAGNOSIS — G253 Myoclonus: Secondary | ICD-10-CM | POA: Insufficient documentation

## 2014-07-19 DIAGNOSIS — G9340 Encephalopathy, unspecified: Secondary | ICD-10-CM | POA: Insufficient documentation

## 2014-07-19 DIAGNOSIS — R569 Unspecified convulsions: Secondary | ICD-10-CM | POA: Diagnosis not present

## 2014-07-19 NOTE — Progress Notes (Signed)
EEG Completed; Results Pending  

## 2014-07-20 DIAGNOSIS — G253 Myoclonus: Secondary | ICD-10-CM

## 2014-07-20 DIAGNOSIS — G9340 Encephalopathy, unspecified: Secondary | ICD-10-CM

## 2014-07-20 NOTE — Telephone Encounter (Signed)
Jade, may tell pt that no evidence of seizure on EEG.   Brain waves just a little slower than expected for age but nothing else.  If tests done, make f/u appt.

## 2014-07-20 NOTE — Procedures (Signed)
ELECTROENCEPHALOGRAM REPORT  Date of Study: 07/19/2014  Patient's Name: Kristy Moore MRN: [id number] Date of Birth: 02-May-1961  Referring Provider: Dr. Wells Guiles Tat  Clinical History: This is a 53 year old woman with polyminimyoclonus and memory loss.  Medications: albuterol (PROAIR HFA) 108 (90 BASE) MCG/ACT inhaler  albuterol (PROVENTIL) (2.5 MG/3ML) 0.083% nebulizer solution amitriptyline (ELAVIL) 25 MG tablet budesonide-formoterol (SYMBICORT) 160-4.5 MCG/ACT inhaler cetirizine (ZYRTEC) 10 MG tablet citalopram (CELEXA) 40 MG tablet guaiFENesin (MUCINEX) 600 MG 12 hr tablet nortriptyline (PAMELOR) 25 MG capsule omeprazole (PRILOSEC) 20 MG capsule tiotropium (SPIRIVA) 18 MCG inhalation capsule traMADol (ULTRAM) 50 MG tablet  Technical Summary: A multichannel digital EEG recording measured by the international 10-20 system with electrodes applied with paste and impedances below 5000 ohms performed as portable with EKG monitoring in an awake and asleep patient.  Hyperventilation was not performed. Photic stimulation was performed.  The digital EEG was referentially recorded, reformatted, and digitally filtered in a variety of bipolar and referential montages for optimal display.   Description: The patient is awake and asleep during the recording.  During maximal wakefulness, there is a symmetric, medium voltage 7 Hz posterior dominant rhythm that attenuates with eye opening. This is admixed with a small amount of diffuse 5-6 Hz theta slowing of the waking background.  During drowsiness and stage I sleep, there is an increase in theta slowing of the background with occasional vertex waves seen. Photic stimulation did not elicit any abnormalities.  There were no epileptiform discharges or electrographic seizures seen.    There were technical difficulties with EKG lead.  Impression: This awake and asleep EEG is abnormal due to the presence of: 1. Slowing of the posterior dominant  rhythm 2. Mild diffuse slowing of the waking background  Clinical Correlation of the above findings indicates diffuse cerebral dysfunction that is non-specific in etiology and can be seen with hypoxic/ischemic injury, toxic/metabolic encephalopathies, neurodegenerative disorders, or medication effect.  The absence of epileptiform discharges does not rule out a clinical diagnosis of epilepsy.  Clinical correlation is advised.   Ellouise Newer, M.D.

## 2014-07-20 NOTE — Telephone Encounter (Signed)
Left message on machine for patient to call back.

## 2014-07-20 NOTE — Telephone Encounter (Signed)
Patient made aware of results. She wanted to wait until after the holidays to follow up. F/U appt made in January.

## 2014-07-20 NOTE — Telephone Encounter (Signed)
FYI EEG report in chart, showed mild diffuse slowing. Thanks

## 2014-08-23 ENCOUNTER — Ambulatory Visit: Payer: Medicare Other | Admitting: Neurology

## 2014-08-23 DIAGNOSIS — Z029 Encounter for administrative examinations, unspecified: Secondary | ICD-10-CM

## 2014-08-24 ENCOUNTER — Encounter: Payer: Self-pay | Admitting: *Deleted

## 2014-08-24 ENCOUNTER — Telehealth: Payer: Self-pay | Admitting: Neurology

## 2014-08-24 NOTE — Progress Notes (Signed)
No show letter sent for 08/23/2014

## 2014-08-24 NOTE — Telephone Encounter (Signed)
Pt no showed 08-23-14 appt w/ Dr. Carles Collet. Appt was verbally confirmed with pt during reminder calls.   Erica - please send pt a no show letter + copy of our no show policy / Sherri S.

## 2014-09-06 ENCOUNTER — Ambulatory Visit: Payer: Medicare Other | Admitting: Family Medicine

## 2014-09-06 ENCOUNTER — Ambulatory Visit: Payer: Medicare Other | Admitting: Family

## 2014-10-02 ENCOUNTER — Other Ambulatory Visit: Payer: Self-pay | Admitting: Family Medicine

## 2014-10-05 ENCOUNTER — Other Ambulatory Visit: Payer: Self-pay | Admitting: Family Medicine

## 2014-10-05 NOTE — Telephone Encounter (Signed)
Last seen 06/02/14 B [facility]  If approved print and route to nurse

## 2014-10-06 NOTE — Telephone Encounter (Signed)
Ultram rx ready for pick up  

## 2014-10-30 ENCOUNTER — Other Ambulatory Visit: Payer: Self-pay | Admitting: Family Medicine

## 2014-10-30 ENCOUNTER — Other Ambulatory Visit: Payer: Self-pay | Admitting: Nurse Practitioner

## 2014-11-08 ENCOUNTER — Other Ambulatory Visit: Payer: Self-pay | Admitting: Family

## 2014-11-08 MED ORDER — TRAMADOL HCL 50 MG PO TABS: ORAL_TABLET | ORAL | Status: DC

## 2014-11-08 NOTE — Telephone Encounter (Signed)
Patient aware rx ready to be picked up 

## 2014-11-08 NOTE — Telephone Encounter (Signed)
Patient is requesting a refill request for tramadol she has an appointment on 4/6

## 2014-11-23 ENCOUNTER — Ambulatory Visit (INDEPENDENT_AMBULATORY_CARE_PROVIDER_SITE_OTHER): Payer: Medicare Other | Admitting: Family

## 2014-11-23 ENCOUNTER — Encounter: Payer: Self-pay | Admitting: Family

## 2014-11-23 VITALS — BP 116/77 | HR 93 | Temp 97.5°F | Ht 64.0 in | Wt 125.8 lb

## 2014-11-23 DIAGNOSIS — L298 Other pruritus: Secondary | ICD-10-CM

## 2014-11-23 DIAGNOSIS — B3731 Acute candidiasis of vulva and vagina: Secondary | ICD-10-CM

## 2014-11-23 DIAGNOSIS — F329 Major depressive disorder, single episode, unspecified: Secondary | ICD-10-CM | POA: Diagnosis not present

## 2014-11-23 DIAGNOSIS — J449 Chronic obstructive pulmonary disease, unspecified: Secondary | ICD-10-CM

## 2014-11-23 DIAGNOSIS — Z23 Encounter for immunization: Secondary | ICD-10-CM

## 2014-11-23 DIAGNOSIS — Z0000 Encounter for general adult medical examination without abnormal findings: Secondary | ICD-10-CM | POA: Diagnosis not present

## 2014-11-23 DIAGNOSIS — R5383 Other fatigue: Secondary | ICD-10-CM

## 2014-11-23 DIAGNOSIS — Z780 Asymptomatic menopausal state: Secondary | ICD-10-CM | POA: Diagnosis not present

## 2014-11-23 DIAGNOSIS — Z008 Encounter for other general examination: Secondary | ICD-10-CM | POA: Diagnosis not present

## 2014-11-23 DIAGNOSIS — K219 Gastro-esophageal reflux disease without esophagitis: Secondary | ICD-10-CM | POA: Diagnosis not present

## 2014-11-23 DIAGNOSIS — B373 Candidiasis of vulva and vagina: Secondary | ICD-10-CM

## 2014-11-23 DIAGNOSIS — N898 Other specified noninflammatory disorders of vagina: Secondary | ICD-10-CM

## 2014-11-23 DIAGNOSIS — E785 Hyperlipidemia, unspecified: Secondary | ICD-10-CM | POA: Diagnosis not present

## 2014-11-23 DIAGNOSIS — E559 Vitamin D deficiency, unspecified: Secondary | ICD-10-CM | POA: Diagnosis not present

## 2014-11-23 DIAGNOSIS — B372 Candidiasis of skin and nail: Secondary | ICD-10-CM | POA: Diagnosis not present

## 2014-11-23 DIAGNOSIS — F32A Depression, unspecified: Secondary | ICD-10-CM | POA: Insufficient documentation

## 2014-11-23 LAB — POCT WET PREP (WET MOUNT)

## 2014-11-23 LAB — POCT UA - MICROSCOPIC ONLY
Casts, Ur, LPF, POC: NEGATIVE
Crystals, Ur, HPF, POC: NEGATIVE
MUCUS UA: NEGATIVE
Yeast, UA: NEGATIVE

## 2014-11-23 LAB — POCT URINALYSIS DIPSTICK
Bilirubin, UA: NEGATIVE
Glucose, UA: NEGATIVE
Ketones, UA: NEGATIVE
NITRITE UA: NEGATIVE
Spec Grav, UA: >=1.03
UROBILINOGEN UA: NEGATIVE
pH, UA: 5

## 2014-11-23 MED ORDER — FLUCONAZOLE 150 MG PO TABS: ORAL_TABLET | ORAL | Status: DC

## 2014-11-23 MED ORDER — NYSTATIN 100000 UNIT/GM EX CREA: 1.0000 "application " | TOPICAL_CREAM | Freq: Two times a day (BID) | CUTANEOUS | Status: DC

## 2014-11-23 NOTE — Progress Notes (Signed)
Subjective:    Patient ID: Kristy Moore, female    DOB: December 08, 1960, 54 y.o.   MRN: [id number]     Vaginal Itching The patient's primary symptoms include genital itching. The patient's pertinent negatives include no genital lesions or genital odor. This is a new problem. The current episode started 1 to 4 weeks ago. The problem occurs intermittently. The problem has been waxing and waning. The pain is mild. Pertinent negatives include no headaches. The symptoms are aggravated by urinating. The treatment provided mild relief.   Pt presents to the office today for chronic follow up. Pt has COPD which she takes Symbicort BID and using the albuterol inhaler "once every 3-4 days". Pt states she continues to smoke, but has tired to stop but has not had an success. PT also has GERD which she takes omeprazole daily. Pt states it is well controlled with not complaints at this time. Pt denies any headache, palpitations,  or edema at this time.    Review of Systems  Constitutional: Negative.   HENT: Negative.   Eyes: Negative.   Respiratory: Negative.  Negative for shortness of breath.   Cardiovascular: Negative.  Negative for palpitations.  Gastrointestinal: Negative.   Endocrine: Negative.   Genitourinary: Negative.   Musculoskeletal: Negative.   Neurological: Negative.  Negative for headaches.  Hematological: Negative.   Psychiatric/Behavioral: Negative.   All other systems reviewed and are negative.      Objective:   Physical Exam  Constitutional: She is oriented to person, place, and time. She appears well-developed and well-nourished. No distress.  HENT:  Head: Normocephalic and atraumatic.  Right Ear: External ear normal.  Left Ear: External ear normal.  Nose: Nose normal.  Mouth/Throat: Oropharynx is clear and moist.  Eyes: Pupils are equal, round, and reactive to light.  Neck: Normal range of motion. Neck supple. No thyromegaly present.  Cardiovascular: Normal rate,  regular rhythm, normal heart sounds and intact distal pulses.   No murmur heard. Pulmonary/Chest: Effort normal and breath sounds normal. No respiratory distress. She has no wheezes.  Abdominal: Soft. Bowel sounds are normal. She exhibits no distension. There is no tenderness.  Musculoskeletal: Normal range of motion. She exhibits no edema or tenderness.  Neurological: She is alert and oriented to person, place, and time. She has normal reflexes. No cranial nerve deficit.  Skin: Skin is warm and dry. Rash (Generalized erythemas rash under bilateral breast) noted.  Psychiatric: She has a normal mood and affect. Her behavior is normal. Judgment and thought content normal.  Vitals reviewed.  BP 116/77 mmHg  Pulse 93  Temp(Src) 97.5 F (36.4 C) (Oral)  Ht 5' 4"  (1.626 m)  Wt 125 lb 12.8 oz (57.063 kg)  BMI 21.58 kg/m2  LMP 09/21/2003        Assessment & Plan:  1. Chronic obstructive pulmonary disease, unspecified COPD, unspecified chronic bronchitis type - CMP14+EGFR  2. Depression - CMP14+EGFR  3. Gastroesophageal reflux disease, esophagitis presence not specified - CMP14+EGFR  4. Physical exam - CMP14+EGFR - Lipid panel - Vit D  25 hydroxy (rtn osteoporosis monitoring) - Thyroid Panel With TSH - DG Bone Density; Future  5. Other fatigue - CMP14+EGFR - Vit D  25 hydroxy (rtn osteoporosis monitoring) - Thyroid Panel With TSH  6. Post-menopausal - CMP14+EGFR - Vit D  25 hydroxy (rtn osteoporosis monitoring) - DG Bone Density; Future  7. Vagina itching -Keep area clean and dry -Cotton underwear - POCT Wet Prep [facility]) - POCT urinalysis dipstick -  POCT UA - Microscopic Only  8. Yeast vaginitis  9. Yeast infection of the skin -Keep area clean and dry - nystatin cream (MYCOSTATIN); Apply 1 application topically 2 (two) times daily.  Dispense: 30 g; Refill: 0 - fluconazole (DIFLUCAN) 150 MG tablet; Take one tablet  Every 3 days  Dispense: 3 tablet; Refill:  0   Continue all meds Labs pending Health Maintenance reviewed- Pt to schedule mammogram, PT got TDAP today Diet and exercise encouraged RTO 6 months  Evelina Dun, FNP

## 2014-11-23 NOTE — Patient Instructions (Addendum)
Health Maintenance Adopting a healthy lifestyle and getting preventive care can go a long way to promote health and wellness. Talk with your health care provider about what schedule of regular examinations is right for you. This is a good chance for you to check in with your provider about disease prevention and staying healthy. In between checkups, there are plenty of things you can do on your own. Experts have done a lot of research about which lifestyle changes and preventive measures are most likely to keep you healthy. Ask your health care provider for more information. WEIGHT AND DIET  Eat a healthy diet  Be sure to include plenty of vegetables, fruits, low-fat dairy products, and lean protein.  Do not eat a lot of foods high in solid fats, added sugars, or salt.  Get regular exercise. This is one of the most important things you can do for your health.  Most adults should exercise for at least 150 minutes each week. The exercise should increase your heart rate and make you sweat (moderate-intensity exercise).  Most adults should also do strengthening exercises at least twice a week. This is in addition to the moderate-intensity exercise.  Maintain a healthy weight  Body mass index (BMI) is a measurement that can be used to identify possible weight problems. It estimates body fat based on height and weight. Your health care provider can help determine your BMI and help you achieve or maintain a healthy weight.  For females 25 years of age and older:   A BMI below 18.5 is considered underweight.  A BMI of 18.5 to 24.9 is normal.  A BMI of 25 to 29.9 is considered overweight.  A BMI of 30 and above is considered obese.  Watch levels of cholesterol and blood lipids  You should start having your blood tested for lipids and cholesterol at 56 years of age, then have this test every 5 years.  You may need to have your cholesterol levels checked more often if:  Your lipid or  cholesterol levels are high.  You are older than 56 years of age.  You are at high risk for heart disease.  CANCER SCREENING   Lung Cancer  Lung cancer screening is recommended for adults 97-92 years old who are at high risk for lung cancer because of a history of smoking.  A yearly low-dose CT scan of the lungs is recommended for people who:  Currently smoke.  Have quit within the past 15 years.  Have at least a 30-pack-year history of smoking. A pack year is smoking an average of one pack of cigarettes a day for 1 year.  Yearly screening should continue until it has been 15 years since you quit.  Yearly screening should stop if you develop a health problem that would prevent you from having lung cancer treatment.  Breast Cancer  Practice breast self-awareness. This means understanding how your breasts normally appear and feel.  It also means doing regular breast self-exams. Let your health care provider know about any changes, no matter how small.  If you are in your 20s or 30s, you should have a clinical breast exam (CBE) by a health care provider every 1-3 years as part of a regular health exam.  If you are 76 or older, have a CBE every year. Also consider having a breast X-ray (mammogram) every year.  If you have a family history of breast cancer, talk to your health care provider about genetic screening.  If you are  at high risk for breast cancer, talk to your health care provider about having an MRI and a mammogram every year.  Breast cancer gene (BRCA) assessment is recommended for women who have family members with BRCA-related cancers. BRCA-related cancers include:  Breast.  Ovarian.  Tubal.  Peritoneal cancers.  Results of the assessment will determine the need for genetic counseling and BRCA1 and BRCA2 testing. Cervical Cancer Routine pelvic examinations to screen for cervical cancer are no longer recommended for nonpregnant women who are considered low  risk for cancer of the pelvic organs (ovaries, uterus, and vagina) and who do not have symptoms. A pelvic examination may be necessary if you have symptoms including those associated with pelvic infections. Ask your health care provider if a screening pelvic exam is right for you.   The Pap test is the screening test for cervical cancer for women who are considered at risk.  If you had a hysterectomy for a problem that was not cancer or a condition that could lead to cancer, then you no longer need Pap tests.  If you are older than 65 years, and you have had normal Pap tests for the past 10 years, you no longer need to have Pap tests.  If you have had past treatment for cervical cancer or a condition that could lead to cancer, you need Pap tests and screening for cancer for at least 20 years after your treatment.  If you no longer get a Pap test, assess your risk factors if they change (such as having a new sexual partner). This can affect whether you should start being screened again.  Some women have medical problems that increase their chance of getting cervical cancer. If this is the case for you, your health care provider may recommend more frequent screening and Pap tests.  The human papillomavirus (HPV) test is another test that may be used for cervical cancer screening. The HPV test looks for the virus that can cause cell changes in the cervix. The cells collected during the Pap test can be tested for HPV.  The HPV test can be used to screen women 30 years of age and older. Getting tested for HPV can extend the interval between normal Pap tests from three to five years.  An HPV test also should be used to screen women of any age who have unclear Pap test results.  After 56 years of age, women should have HPV testing as often as Pap tests.  Colorectal Cancer  This type of cancer can be detected and often prevented.  Routine colorectal cancer screening usually begins at 56 years of  age and continues through 56 years of age.  Your health care provider may recommend screening at an earlier age if you have risk factors for colon cancer.  Your health care provider may also recommend using home test kits to check for hidden blood in the stool.  A small camera at the end of a tube can be used to examine your colon directly (sigmoidoscopy or colonoscopy). This is done to check for the earliest forms of colorectal cancer.  Routine screening usually begins at age 50.  Direct examination of the colon should be repeated every 5-10 years through 56 years of age. However, you may need to be screened more often if early forms of precancerous polyps or small growths are found. Skin Cancer  Check your skin from head to toe regularly.  Tell your health care provider about any new moles or changes in   moles, especially if there is a change in a mole's shape or color.  Also tell your health care provider if you have a mole that is larger than the size of a pencil eraser.  Always use sunscreen. Apply sunscreen liberally and repeatedly throughout the day.  Protect yourself by wearing long sleeves, pants, a wide-brimmed hat, and sunglasses whenever you are outside. HEART DISEASE, DIABETES, AND HIGH BLOOD PRESSURE   Have your blood pressure checked at least every 1-2 years. High blood pressure causes heart disease and increases the risk of stroke.  If you are between 75 years and 42 years old, ask your health care provider if you should take aspirin to prevent strokes.  Have regular diabetes screenings. This involves taking a blood sample to check your fasting blood sugar level.  If you are at a normal weight and have a low risk for diabetes, have this test once every three years after 56 years of age.  If you are overweight and have a high risk for diabetes, consider being tested at a younger age or more often. PREVENTING INFECTION  Hepatitis B  If you have a higher risk for  hepatitis B, you should be screened for this virus. You are considered at high risk for hepatitis B if:  You were born in a country where hepatitis B is common. Ask your health care provider which countries are considered high risk.  Your parents were born in a high-risk country, and you have not been immunized against hepatitis B (hepatitis B vaccine).  You have HIV or AIDS.  You use needles to inject street drugs.  You live with someone who has hepatitis B.  You have had sex with someone who has hepatitis B.  You get hemodialysis treatment.  You take certain medicines for conditions, including cancer, organ transplantation, and autoimmune conditions. Hepatitis C  Blood testing is recommended for:  Everyone born from [birth year] through 1967.  Anyone with known risk factors for hepatitis C. Sexually transmitted infections (STIs)  You should be screened for sexually transmitted infections (STIs) including gonorrhea and chlamydia if:  You are sexually active and are younger than 56 years of age.  You are older than 56 years of age and your health care provider tells you that you are at risk for this type of infection.  Your sexual activity has changed since you were last screened and you are at an increased risk for chlamydia or gonorrhea. Ask your health care provider if you are at risk.  If you do not have HIV, but are at risk, it may be recommended that you take a prescription medicine daily to prevent HIV infection. This is called pre-exposure prophylaxis (PrEP). You are considered at risk if:  You are sexually active and do not regularly use condoms or know the HIV status of your partner(s).  You take drugs by injection.  You are sexually active with a partner who has HIV. Talk with your health care provider about whether you are at high risk of being infected with HIV. If you choose to begin PrEP, you should first be tested for HIV. You should then be tested every 3 months for  as long as you are taking PrEP.  PREGNANCY   If you are premenopausal and you may become pregnant, ask your health care provider about preconception counseling.  If you may become pregnant, take 400 to 800 micrograms (mcg) of folic acid every day.  If you want to prevent pregnancy, talk to your  health care provider about birth control (contraception). OSTEOPOROSIS AND MENOPAUSE   Osteoporosis is a disease in which the bones lose minerals and strength with aging. This can result in serious bone fractures. Your risk for osteoporosis can be identified using a bone density scan.  If you are 25 years of age or older, or if you are at risk for osteoporosis and fractures, ask your health care provider if you should be screened.  Ask your health care provider whether you should take a calcium or vitamin D supplement to lower your risk for osteoporosis.  Menopause may have certain physical symptoms and risks.  Hormone replacement therapy may reduce some of these symptoms and risks. Talk to your health care provider about whether hormone replacement therapy is right for you.  HOME CARE INSTRUCTIONS   Schedule regular health, dental, and eye exams.  Stay current with your immunizations.   Do not use any tobacco products including cigarettes, chewing tobacco, or electronic cigarettes.  If you are pregnant, do not drink alcohol.  If you are breastfeeding, limit how much and how often you drink alcohol.  Limit alcohol intake to no more than 1 drink per day for nonpregnant women. One drink equals 12 ounces of beer, 5 ounces of wine, or 1 ounces of hard liquor.  Do not use street drugs.  Do not share needles.  Ask your health care provider for help if you need support or information about quitting drugs.  Tell your health care provider if you often feel depressed.  Tell your health care provider if you have ever been abused or do not feel safe at home. Document Released: 02/18/2011  Document Revised: 12/20/2013 Document Reviewed: 07/07/2013 [facility] Patient Information 2015 [place], [place]. This information is not intended to replace advice given to you by your health care provider. Make sure you discuss any questions you have with your health care provider. Tinea Versicolor Tinea versicolor is a common yeast infection of the skin. This condition becomes known when the yeast on our skin starts to overgrow (yeast is a normal inhabitant on our skin). This condition is noticed as white or light brown patches on brown skin, and is more evident in the summer on tanned skin. These areas are slightly scaly if scratched. The light patches from the yeast become evident when the yeast creates "holes in your suntan". This is most often noticed in the summer. The patches are usually located on the chest, back, pubis, neck and body folds. However, it may occur on any area of body. Mild itching and inflammation (redness or soreness) may be present. DIAGNOSIS  The diagnosisof this is made clinically (by looking). Cultures from samples are usually not needed. Examination under the microscope may help. However, yeast is normally found on skin. The diagnosis still remains clinical. Examination under Wood's Ultraviolet Light can determine the extent of the infection. TREATMENT  This common infection is usually only of cosmetic (only a concern to your appearance). It is easily treated with dandruff shampoo used during showers or bathing. Vigorous scrubbing will eliminate the yeast over several days time. The light areas in your skin may remain for weeks or months after the infection is cured unless your skin is exposed to sunlight. The lighter or darker spots caused by the fungus that remain after complete treatment are not a sign of treatment failure; it will take a long time to resolve. Your caregiver may recommend a number of commercial preparations or medication by mouth if home care is  not working.  Recurrence is common and preventative medication may be necessary. This skin condition is not highly contagious. Special care is not needed to protect close friends and family members. Normal hygiene is usually enough. Follow up is required only if you develop complications (such as a secondary infection from scratching), if recommended by your caregiver, or if no relief is obtained from the preparations used. Document Released: 08/02/2000 Document Revised: 10/28/2011 Document Reviewed: 09/14/2008 [facility] Patient Information 2015 [place], [place]. This information is not intended to replace advice given to you by your health care provider. Make sure you discuss any questions you have with your health care provider.

## 2014-11-23 NOTE — Addendum Note (Signed)
Addended by: Shelbie Ammons on: 11/23/2014 02:28 PM   Modules accepted: Orders

## 2014-11-24 ENCOUNTER — Other Ambulatory Visit: Payer: Self-pay | Admitting: Family

## 2014-11-24 DIAGNOSIS — E559 Vitamin D deficiency, unspecified: Secondary | ICD-10-CM | POA: Insufficient documentation

## 2014-11-24 LAB — LIPID PANEL
CHOL/HDL RATIO: 2.9 ratio (ref 0.0–4.4)
Cholesterol, Total: 188 mg/dL (ref 100–199)
HDL: 65 mg/dL (ref >39)
LDL Calculated: 109 mg/dL — ABNORMAL HIGH (ref 0–99)
Triglycerides: 72 mg/dL (ref 0–149)
VLDL CHOLESTEROL CAL: 14 mg/dL (ref 5–40)

## 2014-11-24 LAB — CMP14+EGFR
A/G RATIO: 1.6 (ref 1.1–2.5)
ALK PHOS: 80 IU/L (ref 39–117)
ALT: 7 IU/L (ref 0–32)
AST: 18 IU/L (ref 0–40)
Albumin: 4.2 g/dL (ref 3.5–5.5)
BUN / CREAT RATIO: 16 (ref 9–23)
BUN: 11 mg/dL (ref 6–24)
Bilirubin Total: <0.2 mg/dL (ref 0.0–1.2)
CO2: 26 mmol/L (ref 18–29)
CREATININE: 0.67 mg/dL (ref 0.57–1.00)
Calcium: 9.6 mg/dL (ref 8.7–10.2)
Chloride: 100 mmol/L (ref 97–108)
GFR calc Af Amer: 114 mL/min/{1.73_m2} (ref >59)
GFR, EST NON AFRICAN AMERICAN: 99 mL/min/{1.73_m2} (ref >59)
GLOBULIN, TOTAL: 2.7 g/dL (ref 1.5–4.5)
Glucose: 65 mg/dL (ref 65–99)
Potassium: 4.5 mmol/L (ref 3.5–5.2)
SODIUM: 142 mmol/L (ref 134–144)
Total Protein: 6.9 g/dL (ref 6.0–8.5)

## 2014-11-24 LAB — VITAMIN D 25 HYDROXY (VIT D DEFICIENCY, FRACTURES): VIT D 25 HYDROXY: 16.9 ng/mL — AB (ref 30.0–100.0)

## 2014-11-24 LAB — THYROID PANEL WITH TSH
FREE THYROXINE INDEX: 1.5 (ref 1.2–4.9)
T3 UPTAKE RATIO: 22 % — AB (ref 24–39)
T4 TOTAL: 6.6 ug/dL (ref 4.5–12.0)
TSH: 1.07 u[IU]/mL (ref 0.450–4.500)

## 2014-11-24 MED ORDER — ERGOCALCIFEROL 1.25 MG (50000 UT) PO CAPS: 50000.0000 [IU] | ORAL_CAPSULE | ORAL | Status: DC

## 2014-12-08 ENCOUNTER — Other Ambulatory Visit: Payer: Self-pay | Admitting: Family

## 2014-12-08 NOTE — Telephone Encounter (Signed)
Requesting refill on tramadol

## 2014-12-09 ENCOUNTER — Other Ambulatory Visit: Payer: Self-pay | Admitting: Nurse Practitioner

## 2014-12-09 ENCOUNTER — Other Ambulatory Visit: Payer: Self-pay | Admitting: Family Medicine

## 2014-12-09 MED ORDER — TRAMADOL HCL 50 MG PO TABS: ORAL_TABLET | ORAL | Status: DC

## 2014-12-09 NOTE — Telephone Encounter (Signed)
RX ready for pick up 

## 2014-12-09 NOTE — Telephone Encounter (Signed)
Patient aware rx is ready to be picked up 

## 2014-12-12 ENCOUNTER — Other Ambulatory Visit: Payer: Medicare Other

## 2015-01-04 ENCOUNTER — Other Ambulatory Visit: Payer: Self-pay | Admitting: Adult Health

## 2015-01-04 ENCOUNTER — Other Ambulatory Visit: Payer: Self-pay | Admitting: Family

## 2015-01-04 MED ORDER — BUDESONIDE-FORMOTEROL FUMARATE 160-4.5 MCG/ACT IN AERO: 2.0000 | INHALATION_SPRAY | Freq: Two times a day (BID) | RESPIRATORY_TRACT | Status: DC

## 2015-01-04 MED ORDER — TRAMADOL HCL 50 MG PO TABS
ORAL_TABLET | ORAL | Status: DC
Start: 2015-01-04 — End: 2015-02-03

## 2015-01-04 NOTE — Telephone Encounter (Signed)
Pt aware written Rx at front desk ready for pickup

## 2015-02-02 ENCOUNTER — Other Ambulatory Visit: Payer: Self-pay | Admitting: Family

## 2015-02-03 ENCOUNTER — Other Ambulatory Visit: Payer: Self-pay

## 2015-02-03 ENCOUNTER — Other Ambulatory Visit: Payer: Self-pay | Admitting: *Deleted

## 2015-02-03 DIAGNOSIS — B372 Candidiasis of skin and nail: Secondary | ICD-10-CM

## 2015-02-03 MED ORDER — TRAMADOL HCL 50 MG PO TABS: ORAL_TABLET | ORAL | Status: DC

## 2015-02-03 MED ORDER — NYSTATIN 100000 UNIT/GM EX CREA: 1.0000 "application " | TOPICAL_CREAM | Freq: Two times a day (BID) | CUTANEOUS | Status: DC

## 2015-02-03 MED ORDER — ALBUTEROL SULFATE HFA 108 (90 BASE) MCG/ACT IN AERS
2.0000 | INHALATION_SPRAY | Freq: Four times a day (QID) | RESPIRATORY_TRACT | Status: DC | PRN
Start: 2015-02-03 — End: 2015-05-11

## 2015-02-03 MED ORDER — FLUCONAZOLE 150 MG PO TABS: ORAL_TABLET | ORAL | Status: DC

## 2015-02-03 NOTE — Telephone Encounter (Signed)
RX ready for pick up 

## 2015-02-03 NOTE — Telephone Encounter (Signed)
Pt aware.

## 2015-02-03 NOTE — Telephone Encounter (Signed)
Pt is requesting refill, she states the rash was almost gone but now it is back.

## 2015-02-06 ENCOUNTER — Other Ambulatory Visit: Payer: Self-pay

## 2015-02-06 MED ORDER — AMITRIPTYLINE HCL 25 MG PO TABS: 25.0000 mg | ORAL_TABLET | Freq: Every day | ORAL | Status: DC

## 2015-02-10 ENCOUNTER — Other Ambulatory Visit: Payer: Self-pay | Admitting: Family

## 2015-02-13 ENCOUNTER — Other Ambulatory Visit: Payer: Self-pay | Admitting: Family

## 2015-03-02 ENCOUNTER — Telehealth: Payer: Self-pay | Admitting: Family

## 2015-03-02 NOTE — Telephone Encounter (Signed)
Last filled 02/03/15, last seen 11/23/14. Rx will print

## 2015-03-03 MED ORDER — TRAMADOL HCL 50 MG PO TABS: ORAL_TABLET | ORAL | Status: DC

## 2015-03-03 NOTE — Telephone Encounter (Signed)
RX ready for pick up 

## 2015-03-03 NOTE — Telephone Encounter (Signed)
Patient aware rx ready to be picked up 

## 2015-03-06 ENCOUNTER — Other Ambulatory Visit: Payer: Self-pay | Admitting: Family

## 2015-03-09 ENCOUNTER — Other Ambulatory Visit: Payer: Self-pay | Admitting: Family

## 2015-03-31 ENCOUNTER — Telehealth: Payer: Self-pay | Admitting: Family

## 2015-03-31 MED ORDER — TRAMADOL HCL 50 MG PO TABS: ORAL_TABLET | ORAL | Status: DC

## 2015-03-31 NOTE — Telephone Encounter (Signed)
Left message , script ready (ultram).

## 2015-04-07 ENCOUNTER — Other Ambulatory Visit: Payer: Self-pay | Admitting: Family

## 2015-04-10 DIAGNOSIS — J449 Chronic obstructive pulmonary disease, unspecified: Secondary | ICD-10-CM | POA: Diagnosis not present

## 2015-04-10 DIAGNOSIS — R140 Abdominal distension (gaseous): Secondary | ICD-10-CM | POA: Diagnosis not present

## 2015-04-10 DIAGNOSIS — Z8673 Personal history of transient ischemic attack (TIA), and cerebral infarction without residual deficits: Secondary | ICD-10-CM | POA: Diagnosis not present

## 2015-04-10 DIAGNOSIS — Z8614 Personal history of Methicillin resistant Staphylococcus aureus infection: Secondary | ICD-10-CM | POA: Diagnosis not present

## 2015-04-10 DIAGNOSIS — K219 Gastro-esophageal reflux disease without esophagitis: Secondary | ICD-10-CM | POA: Diagnosis not present

## 2015-04-10 DIAGNOSIS — R1084 Generalized abdominal pain: Secondary | ICD-10-CM | POA: Diagnosis not present

## 2015-04-10 DIAGNOSIS — Z87891 Personal history of nicotine dependence: Secondary | ICD-10-CM | POA: Diagnosis not present

## 2015-04-10 DIAGNOSIS — K429 Umbilical hernia without obstruction or gangrene: Secondary | ICD-10-CM | POA: Diagnosis not present

## 2015-04-28 ENCOUNTER — Telehealth: Payer: Self-pay | Admitting: Family

## 2015-04-28 MED ORDER — TRAMADOL HCL 50 MG PO TABS: ORAL_TABLET | ORAL | Status: DC

## 2015-04-28 NOTE — Telephone Encounter (Signed)
Aware, script for pain medication is ready.

## 2015-04-28 NOTE — Telephone Encounter (Signed)
RX ready for pick up 

## 2015-04-29 ENCOUNTER — Other Ambulatory Visit: Payer: Self-pay | Admitting: *Deleted

## 2015-04-29 MED ORDER — TRAMADOL HCL 50 MG PO TABS: ORAL_TABLET | ORAL | Status: DC

## 2015-05-11 ENCOUNTER — Other Ambulatory Visit: Payer: Self-pay | Admitting: Family Medicine

## 2015-05-11 ENCOUNTER — Other Ambulatory Visit: Payer: Self-pay | Admitting: Family

## 2015-05-26 ENCOUNTER — Ambulatory Visit: Payer: Medicare Other | Admitting: Family

## 2015-05-30 ENCOUNTER — Telehealth: Payer: Self-pay | Admitting: Family

## 2015-05-30 ENCOUNTER — Encounter: Payer: Self-pay | Admitting: Family

## 2015-05-30 MED ORDER — TRAMADOL HCL 50 MG PO TABS: ORAL_TABLET | ORAL | Status: DC

## 2015-05-30 NOTE — Telephone Encounter (Signed)
Patient aware that Rx will be ready for pick up after lunch

## 2015-05-30 NOTE — Telephone Encounter (Signed)
RX ready for pick up 

## 2015-06-06 ENCOUNTER — Other Ambulatory Visit: Payer: Self-pay | Admitting: Family

## 2015-06-07 ENCOUNTER — Other Ambulatory Visit: Payer: Self-pay | Admitting: Family

## 2015-06-07 NOTE — Telephone Encounter (Signed)
Last seen 11/23/14 Kristy Moore 

## 2015-06-30 ENCOUNTER — Telehealth: Payer: Self-pay | Admitting: Family

## 2015-06-30 MED ORDER — TRAMADOL HCL 50 MG PO TABS: ORAL_TABLET | ORAL | Status: DC

## 2015-06-30 NOTE — Telephone Encounter (Signed)
Pt aware.

## 2015-06-30 NOTE — Telephone Encounter (Signed)
RX ready for pick up 

## 2015-07-28 ENCOUNTER — Telehealth: Payer: Self-pay | Admitting: Family

## 2015-07-31 ENCOUNTER — Other Ambulatory Visit: Payer: Self-pay | Admitting: Family

## 2015-07-31 MED ORDER — TRAMADOL HCL 50 MG PO TABS: ORAL_TABLET | ORAL | Status: DC

## 2015-07-31 NOTE — Telephone Encounter (Signed)
Last seen 11/23/14, last filled 06/30/15

## 2015-07-31 NOTE — Telephone Encounter (Signed)
RX ready for pick up 

## 2015-07-31 NOTE — Telephone Encounter (Signed)
Patient aware that Rx is ready for pick up 

## 2015-08-01 NOTE — Telephone Encounter (Signed)
Last seen 11/23/14 Christy 

## 2015-08-21 DIAGNOSIS — K5900 Constipation, unspecified: Secondary | ICD-10-CM | POA: Diagnosis present

## 2015-08-21 DIAGNOSIS — W19XXXA Unspecified fall, initial encounter: Secondary | ICD-10-CM | POA: Diagnosis not present

## 2015-08-21 DIAGNOSIS — J189 Pneumonia, unspecified organism: Secondary | ICD-10-CM | POA: Diagnosis not present

## 2015-08-21 DIAGNOSIS — Z23 Encounter for immunization: Secondary | ICD-10-CM | POA: Diagnosis not present

## 2015-08-21 DIAGNOSIS — Z743 Need for continuous supervision: Secondary | ICD-10-CM | POA: Diagnosis not present

## 2015-08-21 DIAGNOSIS — J441 Chronic obstructive pulmonary disease with (acute) exacerbation: Secondary | ICD-10-CM | POA: Diagnosis not present

## 2015-08-21 DIAGNOSIS — F329 Major depressive disorder, single episode, unspecified: Secondary | ICD-10-CM | POA: Diagnosis present

## 2015-08-21 DIAGNOSIS — G9340 Encephalopathy, unspecified: Secondary | ICD-10-CM | POA: Diagnosis present

## 2015-08-21 DIAGNOSIS — R109 Unspecified abdominal pain: Secondary | ICD-10-CM | POA: Diagnosis not present

## 2015-08-21 DIAGNOSIS — M21372 Foot drop, left foot: Secondary | ICD-10-CM | POA: Diagnosis present

## 2015-08-21 DIAGNOSIS — R0600 Dyspnea, unspecified: Secondary | ICD-10-CM | POA: Diagnosis not present

## 2015-08-21 DIAGNOSIS — F17210 Nicotine dependence, cigarettes, uncomplicated: Secondary | ICD-10-CM | POA: Diagnosis present

## 2015-08-21 DIAGNOSIS — R05 Cough: Secondary | ICD-10-CM | POA: Diagnosis not present

## 2015-08-21 DIAGNOSIS — F17200 Nicotine dependence, unspecified, uncomplicated: Secondary | ICD-10-CM | POA: Diagnosis not present

## 2015-08-21 DIAGNOSIS — Z8673 Personal history of transient ischemic attack (TIA), and cerebral infarction without residual deficits: Secondary | ICD-10-CM | POA: Diagnosis not present

## 2015-08-21 DIAGNOSIS — F419 Anxiety disorder, unspecified: Secondary | ICD-10-CM | POA: Diagnosis present

## 2015-08-21 DIAGNOSIS — J9621 Acute and chronic respiratory failure with hypoxia: Secondary | ICD-10-CM | POA: Diagnosis present

## 2015-08-21 DIAGNOSIS — J9601 Acute respiratory failure with hypoxia: Secondary | ICD-10-CM | POA: Diagnosis not present

## 2015-08-21 DIAGNOSIS — F418 Other specified anxiety disorders: Secondary | ICD-10-CM | POA: Diagnosis not present

## 2015-08-21 DIAGNOSIS — M79604 Pain in right leg: Secondary | ICD-10-CM | POA: Diagnosis not present

## 2015-08-21 DIAGNOSIS — Z8614 Personal history of Methicillin resistant Staphylococcus aureus infection: Secondary | ICD-10-CM | POA: Diagnosis not present

## 2015-08-21 DIAGNOSIS — J449 Chronic obstructive pulmonary disease, unspecified: Secondary | ICD-10-CM | POA: Diagnosis not present

## 2015-08-21 DIAGNOSIS — R0602 Shortness of breath: Secondary | ICD-10-CM | POA: Diagnosis not present

## 2015-08-21 DIAGNOSIS — E872 Acidosis: Secondary | ICD-10-CM | POA: Diagnosis not present

## 2015-08-21 DIAGNOSIS — K219 Gastro-esophageal reflux disease without esophagitis: Secondary | ICD-10-CM | POA: Diagnosis not present

## 2015-08-25 DIAGNOSIS — J441 Chronic obstructive pulmonary disease with (acute) exacerbation: Secondary | ICD-10-CM | POA: Diagnosis not present

## 2015-08-25 DIAGNOSIS — Z9981 Dependence on supplemental oxygen: Secondary | ICD-10-CM | POA: Diagnosis not present

## 2015-08-25 DIAGNOSIS — Z9181 History of falling: Secondary | ICD-10-CM | POA: Diagnosis not present

## 2015-08-25 DIAGNOSIS — Z720 Tobacco use: Secondary | ICD-10-CM | POA: Diagnosis not present

## 2015-08-25 DIAGNOSIS — F329 Major depressive disorder, single episode, unspecified: Secondary | ICD-10-CM | POA: Diagnosis not present

## 2015-08-27 ENCOUNTER — Other Ambulatory Visit: Payer: Self-pay | Admitting: Family

## 2015-08-29 NOTE — Telephone Encounter (Signed)
Last seen 11/23/14 Kristy Moore 

## 2015-08-30 ENCOUNTER — Telehealth: Payer: Self-pay | Admitting: Family

## 2015-08-30 NOTE — Telephone Encounter (Signed)
Patient aware, provider will address refill on Thursday when she returns to work.

## 2015-08-31 ENCOUNTER — Other Ambulatory Visit: Payer: Self-pay | Admitting: Family

## 2015-08-31 DIAGNOSIS — Z9981 Dependence on supplemental oxygen: Secondary | ICD-10-CM | POA: Diagnosis not present

## 2015-08-31 DIAGNOSIS — F329 Major depressive disorder, single episode, unspecified: Secondary | ICD-10-CM | POA: Diagnosis not present

## 2015-08-31 DIAGNOSIS — J441 Chronic obstructive pulmonary disease with (acute) exacerbation: Secondary | ICD-10-CM | POA: Diagnosis not present

## 2015-08-31 DIAGNOSIS — Z9181 History of falling: Secondary | ICD-10-CM | POA: Diagnosis not present

## 2015-08-31 DIAGNOSIS — Z720 Tobacco use: Secondary | ICD-10-CM | POA: Diagnosis not present

## 2015-08-31 MED ORDER — TRAMADOL HCL 50 MG PO TABS: ORAL_TABLET | ORAL | Status: DC

## 2015-08-31 NOTE — Telephone Encounter (Signed)
PT needs chronic follow up. RX ready for pick up

## 2015-09-01 DIAGNOSIS — F329 Major depressive disorder, single episode, unspecified: Secondary | ICD-10-CM | POA: Diagnosis not present

## 2015-09-01 DIAGNOSIS — Z9181 History of falling: Secondary | ICD-10-CM | POA: Diagnosis not present

## 2015-09-01 DIAGNOSIS — J441 Chronic obstructive pulmonary disease with (acute) exacerbation: Secondary | ICD-10-CM | POA: Diagnosis not present

## 2015-09-01 DIAGNOSIS — Z9981 Dependence on supplemental oxygen: Secondary | ICD-10-CM | POA: Diagnosis not present

## 2015-09-01 DIAGNOSIS — Z720 Tobacco use: Secondary | ICD-10-CM | POA: Diagnosis not present

## 2015-09-07 DIAGNOSIS — F329 Major depressive disorder, single episode, unspecified: Secondary | ICD-10-CM | POA: Diagnosis not present

## 2015-09-07 DIAGNOSIS — Z9981 Dependence on supplemental oxygen: Secondary | ICD-10-CM | POA: Diagnosis not present

## 2015-09-07 DIAGNOSIS — Z720 Tobacco use: Secondary | ICD-10-CM | POA: Diagnosis not present

## 2015-09-07 DIAGNOSIS — J441 Chronic obstructive pulmonary disease with (acute) exacerbation: Secondary | ICD-10-CM | POA: Diagnosis not present

## 2015-09-07 DIAGNOSIS — Z9181 History of falling: Secondary | ICD-10-CM | POA: Diagnosis not present

## 2015-09-08 DIAGNOSIS — F329 Major depressive disorder, single episode, unspecified: Secondary | ICD-10-CM | POA: Diagnosis not present

## 2015-09-08 DIAGNOSIS — Z720 Tobacco use: Secondary | ICD-10-CM | POA: Diagnosis not present

## 2015-09-08 DIAGNOSIS — J441 Chronic obstructive pulmonary disease with (acute) exacerbation: Secondary | ICD-10-CM | POA: Diagnosis not present

## 2015-09-08 DIAGNOSIS — Z9181 History of falling: Secondary | ICD-10-CM | POA: Diagnosis not present

## 2015-09-08 DIAGNOSIS — Z9981 Dependence on supplemental oxygen: Secondary | ICD-10-CM | POA: Diagnosis not present

## 2015-09-11 DIAGNOSIS — J441 Chronic obstructive pulmonary disease with (acute) exacerbation: Secondary | ICD-10-CM | POA: Diagnosis not present

## 2015-09-11 DIAGNOSIS — F329 Major depressive disorder, single episode, unspecified: Secondary | ICD-10-CM | POA: Diagnosis not present

## 2015-09-11 DIAGNOSIS — Z9981 Dependence on supplemental oxygen: Secondary | ICD-10-CM | POA: Diagnosis not present

## 2015-09-11 DIAGNOSIS — Z720 Tobacco use: Secondary | ICD-10-CM | POA: Diagnosis not present

## 2015-09-11 DIAGNOSIS — Z9181 History of falling: Secondary | ICD-10-CM | POA: Diagnosis not present

## 2015-09-13 DIAGNOSIS — F329 Major depressive disorder, single episode, unspecified: Secondary | ICD-10-CM | POA: Diagnosis not present

## 2015-09-13 DIAGNOSIS — J441 Chronic obstructive pulmonary disease with (acute) exacerbation: Secondary | ICD-10-CM | POA: Diagnosis not present

## 2015-09-13 DIAGNOSIS — Z9181 History of falling: Secondary | ICD-10-CM | POA: Diagnosis not present

## 2015-09-13 DIAGNOSIS — Z720 Tobacco use: Secondary | ICD-10-CM | POA: Diagnosis not present

## 2015-09-13 DIAGNOSIS — Z9981 Dependence on supplemental oxygen: Secondary | ICD-10-CM | POA: Diagnosis not present

## 2015-09-15 DIAGNOSIS — Z9981 Dependence on supplemental oxygen: Secondary | ICD-10-CM | POA: Diagnosis not present

## 2015-09-15 DIAGNOSIS — Z720 Tobacco use: Secondary | ICD-10-CM | POA: Diagnosis not present

## 2015-09-15 DIAGNOSIS — F329 Major depressive disorder, single episode, unspecified: Secondary | ICD-10-CM | POA: Diagnosis not present

## 2015-09-15 DIAGNOSIS — J441 Chronic obstructive pulmonary disease with (acute) exacerbation: Secondary | ICD-10-CM | POA: Diagnosis not present

## 2015-09-15 DIAGNOSIS — Z9181 History of falling: Secondary | ICD-10-CM | POA: Diagnosis not present

## 2015-09-19 DIAGNOSIS — Z9981 Dependence on supplemental oxygen: Secondary | ICD-10-CM | POA: Diagnosis not present

## 2015-09-19 DIAGNOSIS — J441 Chronic obstructive pulmonary disease with (acute) exacerbation: Secondary | ICD-10-CM | POA: Diagnosis not present

## 2015-09-19 DIAGNOSIS — Z720 Tobacco use: Secondary | ICD-10-CM | POA: Diagnosis not present

## 2015-09-19 DIAGNOSIS — F329 Major depressive disorder, single episode, unspecified: Secondary | ICD-10-CM | POA: Diagnosis not present

## 2015-09-19 DIAGNOSIS — Z9181 History of falling: Secondary | ICD-10-CM | POA: Diagnosis not present

## 2015-09-21 ENCOUNTER — Other Ambulatory Visit: Payer: Self-pay | Admitting: Family

## 2015-09-21 NOTE — Telephone Encounter (Signed)
Last seen 11/23/14 Christy 

## 2015-09-25 DIAGNOSIS — F329 Major depressive disorder, single episode, unspecified: Secondary | ICD-10-CM | POA: Diagnosis not present

## 2015-09-25 DIAGNOSIS — Z9181 History of falling: Secondary | ICD-10-CM | POA: Diagnosis not present

## 2015-09-25 DIAGNOSIS — Z720 Tobacco use: Secondary | ICD-10-CM | POA: Diagnosis not present

## 2015-09-25 DIAGNOSIS — J441 Chronic obstructive pulmonary disease with (acute) exacerbation: Secondary | ICD-10-CM | POA: Diagnosis not present

## 2015-09-25 DIAGNOSIS — Z9981 Dependence on supplemental oxygen: Secondary | ICD-10-CM | POA: Diagnosis not present

## 2015-09-26 DIAGNOSIS — J441 Chronic obstructive pulmonary disease with (acute) exacerbation: Secondary | ICD-10-CM | POA: Diagnosis not present

## 2015-09-26 DIAGNOSIS — F329 Major depressive disorder, single episode, unspecified: Secondary | ICD-10-CM | POA: Diagnosis not present

## 2015-09-26 DIAGNOSIS — Z720 Tobacco use: Secondary | ICD-10-CM | POA: Diagnosis not present

## 2015-09-26 DIAGNOSIS — Z9181 History of falling: Secondary | ICD-10-CM | POA: Diagnosis not present

## 2015-09-26 DIAGNOSIS — Z9981 Dependence on supplemental oxygen: Secondary | ICD-10-CM | POA: Diagnosis not present

## 2015-09-28 ENCOUNTER — Encounter: Payer: Self-pay | Admitting: Family

## 2015-09-28 ENCOUNTER — Ambulatory Visit (INDEPENDENT_AMBULATORY_CARE_PROVIDER_SITE_OTHER): Payer: Medicare Other | Admitting: Family

## 2015-09-28 VITALS — BP 122/95 | HR 88 | Temp 97.0°F | Ht 64.0 in | Wt 125.2 lb

## 2015-09-28 DIAGNOSIS — M549 Dorsalgia, unspecified: Secondary | ICD-10-CM

## 2015-09-28 DIAGNOSIS — E785 Hyperlipidemia, unspecified: Secondary | ICD-10-CM

## 2015-09-28 DIAGNOSIS — G8929 Other chronic pain: Secondary | ICD-10-CM

## 2015-09-28 DIAGNOSIS — F329 Major depressive disorder, single episode, unspecified: Secondary | ICD-10-CM | POA: Diagnosis not present

## 2015-09-28 DIAGNOSIS — J432 Centrilobular emphysema: Secondary | ICD-10-CM | POA: Diagnosis not present

## 2015-09-28 DIAGNOSIS — E559 Vitamin D deficiency, unspecified: Secondary | ICD-10-CM

## 2015-09-28 DIAGNOSIS — E876 Hypokalemia: Secondary | ICD-10-CM | POA: Diagnosis not present

## 2015-09-28 DIAGNOSIS — J449 Chronic obstructive pulmonary disease, unspecified: Secondary | ICD-10-CM | POA: Diagnosis not present

## 2015-09-28 DIAGNOSIS — Z1159 Encounter for screening for other viral diseases: Secondary | ICD-10-CM | POA: Diagnosis not present

## 2015-09-28 DIAGNOSIS — F32A Depression, unspecified: Secondary | ICD-10-CM

## 2015-09-28 DIAGNOSIS — K219 Gastro-esophageal reflux disease without esophagitis: Secondary | ICD-10-CM | POA: Diagnosis not present

## 2015-09-28 MED ORDER — AMITRIPTYLINE HCL 25 MG PO TABS: 25.0000 mg | ORAL_TABLET | Freq: Every day | ORAL | Status: DC

## 2015-09-28 MED ORDER — CYCLOBENZAPRINE HCL 5 MG PO TABS: 5.0000 mg | ORAL_TABLET | Freq: Three times a day (TID) | ORAL | Status: DC | PRN

## 2015-09-28 MED ORDER — CITALOPRAM HYDROBROMIDE 40 MG PO TABS
40.0000 mg | ORAL_TABLET | Freq: Every day | ORAL | Status: DC
Start: 2015-09-28 — End: 2015-10-26

## 2015-09-28 MED ORDER — OMEPRAZOLE 40 MG PO CPDR: 40.0000 mg | DELAYED_RELEASE_CAPSULE | Freq: Every day | ORAL | Status: DC

## 2015-09-28 MED ORDER — BUDESONIDE-FORMOTEROL FUMARATE 160-4.5 MCG/ACT IN AERO: INHALATION_SPRAY | RESPIRATORY_TRACT | Status: DC

## 2015-09-28 MED ORDER — TIOTROPIUM BROMIDE MONOHYDRATE 18 MCG IN CAPS: 18.0000 ug | ORAL_CAPSULE | Freq: Every day | RESPIRATORY_TRACT | Status: DC

## 2015-09-28 MED ORDER — TRAMADOL HCL 50 MG PO TABS: ORAL_TABLET | ORAL | Status: DC

## 2015-09-28 NOTE — Patient Instructions (Signed)
Health Maintenance, Female Adopting a healthy lifestyle and getting preventive care can go a long way to promote health and wellness. Talk with your health care provider about what schedule of regular examinations is right for you. This is a good chance for you to check in with your provider about disease prevention and staying healthy. In between checkups, there are plenty of things you can do on your own. Experts have done a lot of research about which lifestyle changes and preventive measures are most likely to keep you healthy. Ask your health care provider for more information. WEIGHT AND DIET  Eat a healthy diet  Be sure to include plenty of vegetables, fruits, low-fat dairy products, and lean protein.  Do not eat a lot of foods high in solid fats, added sugars, or salt.  Get regular exercise. This is one of the most important things you can do for your health.  Most adults should exercise for at least 150 minutes each week. The exercise should increase your heart rate and make you sweat (moderate-intensity exercise).  Most adults should also do strengthening exercises at least twice a week. This is in addition to the moderate-intensity exercise.  Maintain a healthy weight  Body mass index (BMI) is a measurement that can be used to identify possible weight problems. It estimates body fat based on height and weight. Your health care provider can help determine your BMI and help you achieve or maintain a healthy weight.  For females 20 years of age and older:   A BMI below 18.5 is considered underweight.  A BMI of 18.5 to 24.9 is normal.  A BMI of 25 to 29.9 is considered overweight.  A BMI of 30 and above is considered obese.  Watch levels of cholesterol and blood lipids  You should start having your blood tested for lipids and cholesterol at 57 years of age, then have this test every 5 years.  You may need to have your cholesterol levels checked more often if:  Your lipid  or cholesterol levels are high.  You are older than 57 years of age.  You are at high risk for heart disease.  CANCER SCREENING   Lung Cancer  Lung cancer screening is recommended for adults 55-80 years old who are at high risk for lung cancer because of a history of smoking.  A yearly low-dose CT scan of the lungs is recommended for people who:  Currently smoke.  Have quit within the past 15 years.  Have at least a 30-pack-year history of smoking. A pack year is smoking an average of one pack of cigarettes a day for 1 year.  Yearly screening should continue until it has been 15 years since you quit.  Yearly screening should stop if you develop a health problem that would prevent you from having lung cancer treatment.  Breast Cancer  Practice breast self-awareness. This means understanding how your breasts normally appear and feel.  It also means doing regular breast self-exams. Let your health care provider know about any changes, no matter how small.  If you are in your 20s or 30s, you should have a clinical breast exam (CBE) by a health care provider every 1-3 years as part of a regular health exam.  If you are 40 or older, have a CBE every year. Also consider having a breast X-ray (mammogram) every year.  If you have a family history of breast cancer, talk to your health care provider about genetic screening.  If you   are at high risk for breast cancer, talk to your health care provider about having an MRI and a mammogram every year.  Breast cancer gene (BRCA) assessment is recommended for women who have family members with BRCA-related cancers. BRCA-related cancers include:  Breast.  Ovarian.  Tubal.  Peritoneal cancers.  Results of the assessment will determine the need for genetic counseling and BRCA1 and BRCA2 testing. Cervical Cancer Your health care provider may recommend that you be screened regularly for cancer of the pelvic organs (ovaries, uterus, and  vagina). This screening involves a pelvic examination, including checking for microscopic changes to the surface of your cervix (Pap test). You may be encouraged to have this screening done every 3 years, beginning at age 21.  For women ages 30-65, health care providers may recommend pelvic exams and Pap testing every 3 years, or they may recommend the Pap and pelvic exam, combined with testing for human papilloma virus (HPV), every 5 years. Some types of HPV increase your risk of cervical cancer. Testing for HPV may also be done on women of any age with unclear Pap test results.  Other health care providers may not recommend any screening for nonpregnant women who are considered low risk for pelvic cancer and who do not have symptoms. Ask your health care provider if a screening pelvic exam is right for you.  If you have had past treatment for cervical cancer or a condition that could lead to cancer, you need Pap tests and screening for cancer for at least 20 years after your treatment. If Pap tests have been discontinued, your risk factors (such as having a new sexual partner) need to be reassessed to determine if screening should resume. Some women have medical problems that increase the chance of getting cervical cancer. In these cases, your health care provider may recommend more frequent screening and Pap tests. Colorectal Cancer  This type of cancer can be detected and often prevented.  Routine colorectal cancer screening usually begins at 57 years of age and continues through 57 years of age.  Your health care provider may recommend screening at an earlier age if you have risk factors for colon cancer.  Your health care provider may also recommend using home test kits to check for hidden blood in the stool.  A small camera at the end of a tube can be used to examine your colon directly (sigmoidoscopy or colonoscopy). This is done to check for the earliest forms of colorectal  cancer.  Routine screening usually begins at age 50.  Direct examination of the colon should be repeated every 5-10 years through 57 years of age. However, you may need to be screened more often if early forms of precancerous polyps or small growths are found. Skin Cancer  Check your skin from head to toe regularly.  Tell your health care provider about any new moles or changes in moles, especially if there is a change in a mole's shape or color.  Also tell your health care provider if you have a mole that is larger than the size of a pencil eraser.  Always use sunscreen. Apply sunscreen liberally and repeatedly throughout the day.  Protect yourself by wearing long sleeves, pants, a wide-brimmed hat, and sunglasses whenever you are outside. HEART DISEASE, DIABETES, AND HIGH BLOOD PRESSURE   High blood pressure causes heart disease and increases the risk of stroke. High blood pressure is more likely to develop in:  People who have blood pressure in the high end   of the normal range (130-139/85-89 mm Hg).  People who are overweight or obese.  People who are African American.  If you are 38-23 years of age, have your blood pressure checked every 3-5 years. If you are 61 years of age or older, have your blood pressure checked every year. You should have your blood pressure measured twice--once when you are at a hospital or clinic, and once when you are not at a hospital or clinic. Record the average of the two measurements. To check your blood pressure when you are not at a hospital or clinic, you can use:  An automated blood pressure machine at a pharmacy.  A home blood pressure monitor.  If you are between 45 years and 39 years old, ask your health care provider if you should take aspirin to prevent strokes.  Have regular diabetes screenings. This involves taking a blood sample to check your fasting blood sugar level.  If you are at a normal weight and have a low risk for diabetes,  have this test once every three years after 57 years of age.  If you are overweight and have a high risk for diabetes, consider being tested at a younger age or more often. PREVENTING INFECTION  Hepatitis B  If you have a higher risk for hepatitis B, you should be screened for this virus. You are considered at high risk for hepatitis B if:  You were born in a country where hepatitis B is common. Ask your health care provider which countries are considered high risk.  Your parents were born in a high-risk country, and you have not been immunized against hepatitis B (hepatitis B vaccine).  You have HIV or AIDS.  You use needles to inject street drugs.  You live with someone who has hepatitis B.  You have had sex with someone who has hepatitis B.  You get hemodialysis treatment.  You take certain medicines for conditions, including cancer, organ transplantation, and autoimmune conditions. Hepatitis C  Blood testing is recommended for:  Everyone born from [birth year] through 1967.  Anyone with known risk factors for hepatitis C. Sexually transmitted infections (STIs)  You should be screened for sexually transmitted infections (STIs) including gonorrhea and chlamydia if:  You are sexually active and are younger than 57 years of age.  You are older than 57 years of age and your health care provider tells you that you are at risk for this type of infection.  Your sexual activity has changed since you were last screened and you are at an increased risk for chlamydia or gonorrhea. Ask your health care provider if you are at risk.  If you do not have HIV, but are at risk, it may be recommended that you take a prescription medicine daily to prevent HIV infection. This is called pre-exposure prophylaxis (PrEP). You are considered at risk if:  You are sexually active and do not regularly use condoms or know the HIV status of your partner(s).  You take drugs by injection.  You are sexually  active with a partner who has HIV. Talk with your health care provider about whether you are at high risk of being infected with HIV. If you choose to begin PrEP, you should first be tested for HIV. You should then be tested every 3 months for as long as you are taking PrEP.  PREGNANCY   If you are premenopausal and you may become pregnant, ask your health care provider about preconception counseling.  If you may  become pregnant, take 400 to 800 micrograms (mcg) of folic acid every day.  If you want to prevent pregnancy, talk to your health care provider about birth control (contraception). OSTEOPOROSIS AND MENOPAUSE   Osteoporosis is a disease in which the bones lose minerals and strength with aging. This can result in serious bone fractures. Your risk for osteoporosis can be identified using a bone density scan.  If you are 61 years of age or older, or if you are at risk for osteoporosis and fractures, ask your health care provider if you should be screened.  Ask your health care provider whether you should take a calcium or vitamin D supplement to lower your risk for osteoporosis.  Menopause may have certain physical symptoms and risks.  Hormone replacement therapy may reduce some of these symptoms and risks. Talk to your health care provider about whether hormone replacement therapy is right for you.  HOME CARE INSTRUCTIONS   Schedule regular health, dental, and eye exams.  Stay current with your immunizations.   Do not use any tobacco products including cigarettes, chewing tobacco, or electronic cigarettes.  If you are pregnant, do not drink alcohol.  If you are breastfeeding, limit how much and how often you drink alcohol.  Limit alcohol intake to no more than 1 drink per day for nonpregnant women. One drink equals 12 ounces of beer, 5 ounces of wine, or 1 ounces of hard liquor.  Do not use street drugs.  Do not share needles.  Ask your health care provider for help if  you need support or information about quitting drugs.  Tell your health care provider if you often feel depressed.  Tell your health care provider if you have ever been abused or do not feel safe at home.   This information is not intended to replace advice given to you by your health care provider. Make sure you discuss any questions you have with your health care provider.   Document Released: 02/18/2011 Document Revised: 08/26/2014 Document Reviewed: 07/07/2013 Elsevier Interactive Patient Education [employer].

## 2015-09-28 NOTE — Progress Notes (Signed)
Subjective:    Patient ID: Kristy Moore, female    DOB: 11/16/1960, 55 y.o.   MRN: [id number]    Pt presents to the office today for chronic follow up. Pt has COPD which she takes Symbicort BID, spiriva daily,and using the albuterol inhaler "once everyday". PT educated on the importance of only using albuterol inhaler as needed. Pt states she continues to smoke, but has tired to stop but has not had an success.  Gastroesophageal Reflux She complains of coughing, dysphagia and heartburn. She reports no nausea or no sore throat. This is a chronic problem. The current episode started more than 1 year ago. The problem occurs frequently. The problem has been waxing and waning. The heartburn does not wake her from sleep. The heartburn does not limit her activity. The symptoms are aggravated by certain foods, smoking and stress. She has tried a PPI for the symptoms. The treatment provided mild relief.  Depression      The patient presents with depression.  This is a chronic problem.  The current episode started more than 1 year ago.   The onset quality is gradual.   The problem occurs intermittently.  The problem has been waxing and waning since onset.  Associated symptoms include no helplessness, no hopelessness, no restlessness, not sad and no suicidal ideas.  Past treatments include SNRIs - Serotonin and norepinephrine reuptake inhibitors.  Compliance with treatment is good.  Past medical history includes depression.   Hyperlipidemia This is a chronic problem. The current episode started more than 1 year ago. The problem is uncontrolled. Recent lipid tests were reviewed and are high. Factors aggravating her hyperlipidemia include smoking. Pertinent negatives include no shortness of breath. Current antihyperlipidemic treatment includes diet change. The current treatment provides mild improvement of lipids. Risk factors for coronary artery disease include dyslipidemia, post-menopausal and family history.   Back Pain This is a chronic problem. The current episode started more than 1 month ago. The problem occurs intermittently. The problem has been waxing and waning since onset. The pain is present in the lumbar spine. The quality of the pain is described as aching. The pain radiates to the right thigh and left thigh. The pain is at a severity of 10/10. The pain is moderate. The symptoms are aggravated by sitting and standing. Associated symptoms include numbness and tingling. Pertinent negatives include no bladder incontinence or bowel incontinence. She has tried analgesics and muscle relaxant for the symptoms. The treatment provided mild relief.       Review of Systems  Constitutional: Negative.   HENT: Negative.  Negative for sore throat.   Eyes: Negative.   Respiratory: Positive for cough. Negative for shortness of breath.   Cardiovascular: Negative.  Negative for palpitations.  Gastrointestinal: Positive for heartburn and dysphagia. Negative for nausea and bowel incontinence.  Endocrine: Negative.   Genitourinary: Negative.  Negative for bladder incontinence.  Musculoskeletal: Positive for back pain.  Neurological: Positive for tingling and numbness.  Hematological: Negative.   Psychiatric/Behavioral: Positive for depression. Negative for suicidal ideas.  All other systems reviewed and are negative.      Objective:   Physical Exam  Constitutional: She is oriented to person, place, and time. She appears well-developed and well-nourished. No distress.  HENT:  Head: Normocephalic and atraumatic.  Right Ear: External ear normal.  Left Ear: External ear normal.  Nose: Nose normal.  Mouth/Throat: Oropharynx is clear and moist.  Eyes: Pupils are equal, round, and reactive to light.  Neck: Normal range  of motion. Neck supple. No thyromegaly present.  Cardiovascular: Normal rate, regular rhythm, normal heart sounds and intact distal pulses.   No murmur heard. Pulmonary/Chest: Effort  normal. No respiratory distress. She has wheezes.  Abdominal: Soft. Bowel sounds are normal. She exhibits no distension. There is no tenderness.  Musculoskeletal: Normal range of motion. She exhibits no edema or tenderness.  Neurological: She is alert and oriented to person, place, and time. She has normal reflexes. No cranial nerve deficit.  Skin: Skin is warm and dry. Rash (Generalized erythemas rash under bilateral breast) noted.  Psychiatric: She has a normal mood and affect. Her behavior is normal. Judgment and thought content normal.  Vitals reviewed.  BP 122/95 mmHg  Pulse 88  Temp(Src) 97 F (36.1 C) (Oral)  Ht 5' 4"  (1.626 m)  Wt 125 lb 3.2 oz (56.79 kg)  BMI 21.48 kg/m2  LMP 09/21/2003        Assessment & Plan:  1. Chronic obstructive pulmonary disease, unspecified COPD type (HCC) - CMP14+EGFR - tiotropium (SPIRIVA) 18 MCG inhalation capsule; Place 1 capsule (18 mcg total) into inhaler and inhale daily.  Dispense: 30 capsule; Refill: 6 - budesonide-formoterol (SYMBICORT) 160-4.5 MCG/ACT inhaler; INHALE 2 PUFFS INTO THE LUNGS 2 (TWO) TIMES DAILY.  Dispense: 10.2 Inhaler; Refill: 3  2. Depression - CMP14+EGFR - amitriptyline (ELAVIL) 25 MG tablet; Take 1 tablet (25 mg total) by mouth at bedtime.  Dispense: 90 tablet; Refill: 0 - citalopram (CELEXA) 40 MG tablet; Take 1 tablet (40 mg total) by mouth daily.  Dispense: 90 tablet; Refill: 0  3. Vitamin D deficiency - CMP14+EGFR  4. Hypokalemia - CMP14+EGFR  5. Need for hepatitis C screening test - CMP14+EGFR - Hepatitis C antibody  6. Hyperlipidemia - CMP14+EGFR - Lipid panel  7. Gastroesophageal reflux disease, esophagitis presence not specified - CMP14+EGFR - omeprazole (PRILOSEC) 40 MG capsule; Take 1 capsule (40 mg total) by mouth daily.  Dispense: 30 capsule; Refill: 3  8. Chronic back pain -PT requesting increase in Ultram- PT at max dose- Will add flexeril 5 mg BID prn today - CMP14+EGFR - traMADol  (ULTRAM) 50 MG tablet; TAKE 2 TABLETS 3 TIMES A DAY  Dispense: 180 tablet; Refill: 0 - cyclobenzaprine (FLEXERIL) 5 MG tablet; Take 1 tablet (5 mg total) by mouth 3 (three) times daily as needed for muscle spasms.  Dispense: 60 tablet; Refill: 6  9. Centrilobular emphysema (HCC) - tiotropium (SPIRIVA) 18 MCG inhalation capsule; Place 1 capsule (18 mcg total) into inhaler and inhale daily.  Dispense: 30 capsule; Refill: 6   Continue all meds Labs pending Health Maintenance reviewed Diet and exercise encouraged RTO 3 months  Evelina Dun, FNP

## 2015-09-29 DIAGNOSIS — Z9181 History of falling: Secondary | ICD-10-CM | POA: Diagnosis not present

## 2015-09-29 DIAGNOSIS — Z9981 Dependence on supplemental oxygen: Secondary | ICD-10-CM | POA: Diagnosis not present

## 2015-09-29 DIAGNOSIS — Z720 Tobacco use: Secondary | ICD-10-CM | POA: Diagnosis not present

## 2015-09-29 DIAGNOSIS — F329 Major depressive disorder, single episode, unspecified: Secondary | ICD-10-CM | POA: Diagnosis not present

## 2015-09-29 DIAGNOSIS — J441 Chronic obstructive pulmonary disease with (acute) exacerbation: Secondary | ICD-10-CM | POA: Diagnosis not present

## 2015-09-29 LAB — CMP14+EGFR
ALBUMIN: 4.2 g/dL (ref 3.5–5.5)
ALT: 6 IU/L (ref 0–32)
AST: 15 IU/L (ref 0–40)
Albumin/Globulin Ratio: 2 (ref 1.1–2.5)
Alkaline Phosphatase: 82 IU/L (ref 39–117)
BUN / CREAT RATIO: 11 (ref 9–23)
BUN: 7 mg/dL (ref 6–24)
Bilirubin Total: <0.2 mg/dL (ref 0.0–1.2)
CO2: 26 mmol/L (ref 18–29)
CREATININE: 0.61 mg/dL (ref 0.57–1.00)
Calcium: 9.7 mg/dL (ref 8.7–10.2)
Chloride: 96 mmol/L (ref 96–106)
GFR, EST AFRICAN AMERICAN: 117 mL/min/{1.73_m2} (ref >59)
GFR, EST NON AFRICAN AMERICAN: 102 mL/min/{1.73_m2} (ref >59)
Globulin, Total: 2.1 g/dL (ref 1.5–4.5)
Glucose: 54 mg/dL — ABNORMAL LOW (ref 65–99)
Potassium: 4.5 mmol/L (ref 3.5–5.2)
Sodium: 139 mmol/L (ref 134–144)
TOTAL PROTEIN: 6.3 g/dL (ref 6.0–8.5)

## 2015-09-29 LAB — LIPID PANEL
CHOL/HDL RATIO: 3.3 ratio (ref 0.0–4.4)
Cholesterol, Total: 197 mg/dL (ref 100–199)
HDL: 59 mg/dL (ref >39)
LDL CALC: 115 mg/dL — AB (ref 0–99)
TRIGLYCERIDES: 115 mg/dL (ref 0–149)
VLDL CHOLESTEROL CAL: 23 mg/dL (ref 5–40)

## 2015-09-29 LAB — HEPATITIS C ANTIBODY: Hep C Virus Ab: <0.1 s/co ratio (ref 0.0–0.9)

## 2015-10-02 DIAGNOSIS — J441 Chronic obstructive pulmonary disease with (acute) exacerbation: Secondary | ICD-10-CM | POA: Diagnosis not present

## 2015-10-02 DIAGNOSIS — Z9181 History of falling: Secondary | ICD-10-CM | POA: Diagnosis not present

## 2015-10-02 DIAGNOSIS — F329 Major depressive disorder, single episode, unspecified: Secondary | ICD-10-CM | POA: Diagnosis not present

## 2015-10-02 DIAGNOSIS — Z720 Tobacco use: Secondary | ICD-10-CM | POA: Diagnosis not present

## 2015-10-02 DIAGNOSIS — Z9981 Dependence on supplemental oxygen: Secondary | ICD-10-CM | POA: Diagnosis not present

## 2015-10-24 ENCOUNTER — Other Ambulatory Visit: Payer: Self-pay | Admitting: Family

## 2015-10-24 DIAGNOSIS — M549 Dorsalgia, unspecified: Principal | ICD-10-CM

## 2015-10-24 DIAGNOSIS — G8929 Other chronic pain: Secondary | ICD-10-CM

## 2015-10-26 ENCOUNTER — Other Ambulatory Visit: Payer: Self-pay | Admitting: Family

## 2015-10-26 MED ORDER — TRAMADOL HCL 50 MG PO TABS: ORAL_TABLET | ORAL | Status: DC

## 2015-10-26 NOTE — Telephone Encounter (Signed)
Prescription ready for pick up.

## 2015-10-26 NOTE — Telephone Encounter (Signed)
Pt aware of written Rx is at front desk ready for pickup 

## 2015-10-28 ENCOUNTER — Other Ambulatory Visit: Payer: Self-pay | Admitting: Family

## 2015-11-17 ENCOUNTER — Encounter: Payer: Self-pay | Admitting: *Deleted

## 2015-11-24 ENCOUNTER — Telehealth: Payer: Self-pay | Admitting: Family

## 2015-11-24 DIAGNOSIS — G8929 Other chronic pain: Secondary | ICD-10-CM

## 2015-11-24 DIAGNOSIS — M549 Dorsalgia, unspecified: Principal | ICD-10-CM

## 2015-11-24 MED ORDER — TRAMADOL HCL 50 MG PO TABS: ORAL_TABLET | ORAL | Status: DC

## 2015-11-24 NOTE — Telephone Encounter (Signed)
Patient aware.

## 2015-11-24 NOTE — Telephone Encounter (Signed)
RX ready for pick up. PT needs appt for pain contract  

## 2015-11-26 ENCOUNTER — Other Ambulatory Visit: Payer: Self-pay | Admitting: Family

## 2015-12-24 ENCOUNTER — Other Ambulatory Visit: Payer: Self-pay | Admitting: Family

## 2015-12-25 ENCOUNTER — Other Ambulatory Visit: Payer: Self-pay | Admitting: Family

## 2015-12-25 DIAGNOSIS — M549 Dorsalgia, unspecified: Principal | ICD-10-CM

## 2015-12-25 DIAGNOSIS — G8929 Other chronic pain: Secondary | ICD-10-CM

## 2015-12-26 MED ORDER — TRAMADOL HCL 50 MG PO TABS: ORAL_TABLET | ORAL | Status: DC

## 2015-12-26 NOTE — Telephone Encounter (Signed)
Left message that rx called in to [facility]

## 2015-12-27 ENCOUNTER — Encounter (INDEPENDENT_AMBULATORY_CARE_PROVIDER_SITE_OTHER): Payer: Self-pay

## 2015-12-27 ENCOUNTER — Encounter: Payer: Self-pay | Admitting: Family

## 2015-12-28 ENCOUNTER — Encounter: Payer: Self-pay | Admitting: Family

## 2016-01-05 ENCOUNTER — Ambulatory Visit (INDEPENDENT_AMBULATORY_CARE_PROVIDER_SITE_OTHER): Payer: Medicare Other

## 2016-01-05 ENCOUNTER — Encounter: Payer: Self-pay | Admitting: Family

## 2016-01-05 ENCOUNTER — Ambulatory Visit (INDEPENDENT_AMBULATORY_CARE_PROVIDER_SITE_OTHER): Payer: Medicare Other | Admitting: Family

## 2016-01-05 VITALS — BP 116/79 | HR 93 | Temp 97.9°F | Ht 64.0 in | Wt 132.2 lb

## 2016-01-05 DIAGNOSIS — Z0000 Encounter for general adult medical examination without abnormal findings: Secondary | ICD-10-CM

## 2016-01-05 DIAGNOSIS — Z780 Asymptomatic menopausal state: Secondary | ICD-10-CM

## 2016-01-05 DIAGNOSIS — Z1239 Encounter for other screening for malignant neoplasm of breast: Secondary | ICD-10-CM | POA: Diagnosis not present

## 2016-01-05 DIAGNOSIS — J449 Chronic obstructive pulmonary disease, unspecified: Secondary | ICD-10-CM | POA: Diagnosis not present

## 2016-01-05 DIAGNOSIS — Z01419 Encounter for gynecological examination (general) (routine) without abnormal findings: Secondary | ICD-10-CM

## 2016-01-05 DIAGNOSIS — K219 Gastro-esophageal reflux disease without esophagitis: Secondary | ICD-10-CM | POA: Diagnosis not present

## 2016-01-05 DIAGNOSIS — F329 Major depressive disorder, single episode, unspecified: Secondary | ICD-10-CM | POA: Diagnosis not present

## 2016-01-05 DIAGNOSIS — Z720 Tobacco use: Secondary | ICD-10-CM

## 2016-01-05 DIAGNOSIS — K429 Umbilical hernia without obstruction or gangrene: Secondary | ICD-10-CM

## 2016-01-05 DIAGNOSIS — E785 Hyperlipidemia, unspecified: Secondary | ICD-10-CM

## 2016-01-05 DIAGNOSIS — R6889 Other general symptoms and signs: Secondary | ICD-10-CM | POA: Diagnosis not present

## 2016-01-05 DIAGNOSIS — E559 Vitamin D deficiency, unspecified: Secondary | ICD-10-CM

## 2016-01-05 DIAGNOSIS — M549 Dorsalgia, unspecified: Secondary | ICD-10-CM

## 2016-01-05 DIAGNOSIS — F17200 Nicotine dependence, unspecified, uncomplicated: Secondary | ICD-10-CM

## 2016-01-05 DIAGNOSIS — M810 Age-related osteoporosis without current pathological fracture: Secondary | ICD-10-CM | POA: Diagnosis not present

## 2016-01-05 DIAGNOSIS — G8929 Other chronic pain: Secondary | ICD-10-CM | POA: Insufficient documentation

## 2016-01-05 DIAGNOSIS — E876 Hypokalemia: Secondary | ICD-10-CM | POA: Diagnosis not present

## 2016-01-05 DIAGNOSIS — F32A Depression, unspecified: Secondary | ICD-10-CM

## 2016-01-05 MED ORDER — HYDROCODONE-ACETAMINOPHEN 7.5-325 MG PO TABS
1.0000 | ORAL_TABLET | Freq: Two times a day (BID) | ORAL | Status: DC | PRN
Start: 2016-01-05 — End: 2016-01-29
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
## 2016-01-05 NOTE — Patient Instructions (Signed)
Health Maintenance, Female Adopting a healthy lifestyle and getting preventive care can go a long way to promote health and wellness. Talk with your health care provider about what schedule of regular examinations is right for you. This is a good chance for you to check in with your provider about disease prevention and staying healthy. In between checkups, there are plenty of things you can do on your own. Experts have done a lot of research about which lifestyle changes and preventive measures are most likely to keep you healthy. Ask your health care provider for more information. WEIGHT AND DIET  Eat a healthy diet  Be sure to include plenty of vegetables, fruits, low-fat dairy products, and lean protein.  Do not eat a lot of foods high in solid fats, added sugars, or salt.  Get regular exercise. This is one of the most important things you can do for your health.  Most adults should exercise for at least 150 minutes each week. The exercise should increase your heart rate and make you sweat (moderate-intensity exercise).  Most adults should also do strengthening exercises at least twice a week. This is in addition to the moderate-intensity exercise.  Maintain a healthy weight  Body mass index (BMI) is a measurement that can be used to identify possible weight problems. It estimates body fat based on height and weight. Your health care provider can help determine your BMI and help you achieve or maintain a healthy weight.  For females 20 years of age and older:   A BMI below 18.5 is considered underweight.  A BMI of 18.5 to 24.9 is normal.  A BMI of 25 to 29.9 is considered overweight.  A BMI of 30 and above is considered obese.  Watch levels of cholesterol and blood lipids  You should start having your blood tested for lipids and cholesterol at 57 years of age, then have this test every 5 years.  You may need to have your cholesterol levels checked more often if:  Your lipid  or cholesterol levels are high.  You are older than 57 years of age.  You are at high risk for heart disease.  CANCER SCREENING   Lung Cancer  Lung cancer screening is recommended for adults 55-80 years old who are at high risk for lung cancer because of a history of smoking.  A yearly low-dose CT scan of the lungs is recommended for people who:  Currently smoke.  Have quit within the past 15 years.  Have at least a 30-pack-year history of smoking. A pack year is smoking an average of one pack of cigarettes a day for 1 year.  Yearly screening should continue until it has been 15 years since you quit.  Yearly screening should stop if you develop a health problem that would prevent you from having lung cancer treatment.  Breast Cancer  Practice breast self-awareness. This means understanding how your breasts normally appear and feel.  It also means doing regular breast self-exams. Let your health care provider know about any changes, no matter how small.  If you are in your 20s or 30s, you should have a clinical breast exam (CBE) by a health care provider every 1-3 years as part of a regular health exam.  If you are 40 or older, have a CBE every year. Also consider having a breast X-ray (mammogram) every year.  If you have a family history of breast cancer, talk to your health care provider about genetic screening.  If you   are at high risk for breast cancer, talk to your health care provider about having an MRI and a mammogram every year.  Breast cancer gene (BRCA) assessment is recommended for women who have family members with BRCA-related cancers. BRCA-related cancers include:  Breast.  Ovarian.  Tubal.  Peritoneal cancers.  Results of the assessment will determine the need for genetic counseling and BRCA1 and BRCA2 testing. Cervical Cancer Your health care provider may recommend that you be screened regularly for cancer of the pelvic organs (ovaries, uterus, and  vagina). This screening involves a pelvic examination, including checking for microscopic changes to the surface of your cervix (Pap test). You may be encouraged to have this screening done every 3 years, beginning at age 21.  For women ages 30-65, health care providers may recommend pelvic exams and Pap testing every 3 years, or they may recommend the Pap and pelvic exam, combined with testing for human papilloma virus (HPV), every 5 years. Some types of HPV increase your risk of cervical cancer. Testing for HPV may also be done on women of any age with unclear Pap test results.  Other health care providers may not recommend any screening for nonpregnant women who are considered low risk for pelvic cancer and who do not have symptoms. Ask your health care provider if a screening pelvic exam is right for you.  If you have had past treatment for cervical cancer or a condition that could lead to cancer, you need Pap tests and screening for cancer for at least 20 years after your treatment. If Pap tests have been discontinued, your risk factors (such as having a new sexual partner) need to be reassessed to determine if screening should resume. Some women have medical problems that increase the chance of getting cervical cancer. In these cases, your health care provider may recommend more frequent screening and Pap tests. Colorectal Cancer  This type of cancer can be detected and often prevented.  Routine colorectal cancer screening usually begins at 57 years of age and continues through 57 years of age.  Your health care provider may recommend screening at an earlier age if you have risk factors for colon cancer.  Your health care provider may also recommend using home test kits to check for hidden blood in the stool.  A small camera at the end of a tube can be used to examine your colon directly (sigmoidoscopy or colonoscopy). This is done to check for the earliest forms of colorectal  cancer.  Routine screening usually begins at age 50.  Direct examination of the colon should be repeated every 5-10 years through 57 years of age. However, you may need to be screened more often if early forms of precancerous polyps or small growths are found. Skin Cancer  Check your skin from head to toe regularly.  Tell your health care provider about any new moles or changes in moles, especially if there is a change in a mole's shape or color.  Also tell your health care provider if you have a mole that is larger than the size of a pencil eraser.  Always use sunscreen. Apply sunscreen liberally and repeatedly throughout the day.  Protect yourself by wearing long sleeves, pants, a wide-brimmed hat, and sunglasses whenever you are outside. HEART DISEASE, DIABETES, AND HIGH BLOOD PRESSURE   High blood pressure causes heart disease and increases the risk of stroke. High blood pressure is more likely to develop in:  People who have blood pressure in the high end   of the normal range (130-139/85-89 mm Hg).  People who are overweight or obese.  People who are African American.  If you are 38-23 years of age, have your blood pressure checked every 3-5 years. If you are 61 years of age or older, have your blood pressure checked every year. You should have your blood pressure measured twice--once when you are at a hospital or clinic, and once when you are not at a hospital or clinic. Record the average of the two measurements. To check your blood pressure when you are not at a hospital or clinic, you can use:  An automated blood pressure machine at a pharmacy.  A home blood pressure monitor.  If you are between 45 years and 39 years old, ask your health care provider if you should take aspirin to prevent strokes.  Have regular diabetes screenings. This involves taking a blood sample to check your fasting blood sugar level.  If you are at a normal weight and have a low risk for diabetes,  have this test once every three years after 57 years of age.  If you are overweight and have a high risk for diabetes, consider being tested at a younger age or more often. PREVENTING INFECTION  Hepatitis B  If you have a higher risk for hepatitis B, you should be screened for this virus. You are considered at high risk for hepatitis B if:  You were born in a country where hepatitis B is common. Ask your health care provider which countries are considered high risk.  Your parents were born in a high-risk country, and you have not been immunized against hepatitis B (hepatitis B vaccine).  You have HIV or AIDS.  You use needles to inject street drugs.  You live with someone who has hepatitis B.  You have had sex with someone who has hepatitis B.  You get hemodialysis treatment.  You take certain medicines for conditions, including cancer, organ transplantation, and autoimmune conditions. Hepatitis C  Blood testing is recommended for:  Everyone born from [birth year] through 1967.  Anyone with known risk factors for hepatitis C. Sexually transmitted infections (STIs)  You should be screened for sexually transmitted infections (STIs) including gonorrhea and chlamydia if:  You are sexually active and are younger than 57 years of age.  You are older than 57 years of age and your health care provider tells you that you are at risk for this type of infection.  Your sexual activity has changed since you were last screened and you are at an increased risk for chlamydia or gonorrhea. Ask your health care provider if you are at risk.  If you do not have HIV, but are at risk, it may be recommended that you take a prescription medicine daily to prevent HIV infection. This is called pre-exposure prophylaxis (PrEP). You are considered at risk if:  You are sexually active and do not regularly use condoms or know the HIV status of your partner(s).  You take drugs by injection.  You are sexually  active with a partner who has HIV. Talk with your health care provider about whether you are at high risk of being infected with HIV. If you choose to begin PrEP, you should first be tested for HIV. You should then be tested every 3 months for as long as you are taking PrEP.  PREGNANCY   If you are premenopausal and you may become pregnant, ask your health care provider about preconception counseling.  If you may  become pregnant, take 400 to 800 micrograms (mcg) of folic acid every day.  If you want to prevent pregnancy, talk to your health care provider about birth control (contraception). OSTEOPOROSIS AND MENOPAUSE   Osteoporosis is a disease in which the bones lose minerals and strength with aging. This can result in serious bone fractures. Your risk for osteoporosis can be identified using a bone density scan.  If you are 61 years of age or older, or if you are at risk for osteoporosis and fractures, ask your health care provider if you should be screened.  Ask your health care provider whether you should take a calcium or vitamin D supplement to lower your risk for osteoporosis.  Menopause may have certain physical symptoms and risks.  Hormone replacement therapy may reduce some of these symptoms and risks. Talk to your health care provider about whether hormone replacement therapy is right for you.  HOME CARE INSTRUCTIONS   Schedule regular health, dental, and eye exams.  Stay current with your immunizations.   Do not use any tobacco products including cigarettes, chewing tobacco, or electronic cigarettes.  If you are pregnant, do not drink alcohol.  If you are breastfeeding, limit how much and how often you drink alcohol.  Limit alcohol intake to no more than 1 drink per day for nonpregnant women. One drink equals 12 ounces of beer, 5 ounces of wine, or 1 ounces of hard liquor.  Do not use street drugs.  Do not share needles.  Ask your health care provider for help if  you need support or information about quitting drugs.  Tell your health care provider if you often feel depressed.  Tell your health care provider if you have ever been abused or do not feel safe at home.   This information is not intended to replace advice given to you by your health care provider. Make sure you discuss any questions you have with your health care provider.   Document Released: 02/18/2011 Document Revised: 08/26/2014 Document Reviewed: 07/07/2013 Elsevier Interactive Patient Education [employer].

## 2016-01-06 LAB — CMP14+EGFR
ALBUMIN: 3.9 g/dL (ref 3.5–5.5)
ALK PHOS: 74 IU/L (ref 39–117)
ALT: 11 IU/L (ref 0–32)
AST: 27 IU/L (ref 0–40)
Albumin/Globulin Ratio: 1.8 (ref 1.2–2.2)
BUN / CREAT RATIO: 9 (ref 9–23)
BUN: 5 mg/dL — ABNORMAL LOW (ref 6–24)
CO2: 32 mmol/L — AB (ref 18–29)
CREATININE: 0.56 mg/dL — AB (ref 0.57–1.00)
Calcium: 9 mg/dL (ref 8.7–10.2)
Chloride: 99 mmol/L (ref 96–106)
GFR calc non Af Amer: 105 mL/min/{1.73_m2} (ref >59)
GFR, EST AFRICAN AMERICAN: 121 mL/min/{1.73_m2} (ref >59)
GLOBULIN, TOTAL: 2.2 g/dL (ref 1.5–4.5)
Glucose: 72 mg/dL (ref 65–99)
Potassium: 4 mmol/L (ref 3.5–5.2)
SODIUM: 145 mmol/L — AB (ref 134–144)
Total Protein: 6.1 g/dL (ref 6.0–8.5)

## 2016-01-06 LAB — ANEMIA PROFILE B
BASOS: 1 %
Basophils Absolute: 0.1 10*3/uL (ref 0.0–0.2)
EOS (ABSOLUTE): 0.4 10*3/uL (ref 0.0–0.4)
EOS: 4 %
FERRITIN: 42 ng/mL (ref 15–150)
Folate: 4 ng/mL (ref >3.0)
HEMATOCRIT: 40.6 % (ref 34.0–46.6)
HEMOGLOBIN: 13.3 g/dL (ref 11.1–15.9)
IMMATURE GRANS (ABS): 0.1 10*3/uL (ref 0.0–0.1)
IRON SATURATION: 11 % — AB (ref 15–55)
IRON: 29 ug/dL (ref 27–159)
Immature Granulocytes: 1 %
LYMPHS: 25 %
Lymphocytes Absolute: 2.8 10*3/uL (ref 0.7–3.1)
MCH: 29.3 pg (ref 26.6–33.0)
MCHC: 32.8 g/dL (ref 31.5–35.7)
MCV: 89 fL (ref 79–97)
MONOCYTES: 9 %
Monocytes Absolute: 1 10*3/uL — ABNORMAL HIGH (ref 0.1–0.9)
NEUTROS ABS: 6.6 10*3/uL (ref 1.4–7.0)
Neutrophils: 60 %
Platelets: 318 10*3/uL (ref 150–379)
RBC: 4.54 x10E6/uL (ref 3.77–5.28)
RDW: 14.9 % (ref 12.3–15.4)
RETIC CT PCT: 1.1 % (ref 0.6–2.6)
TIBC: 270 ug/dL (ref 250–450)
UIBC: 241 ug/dL (ref 131–425)
Vitamin B-12: 1344 pg/mL — ABNORMAL HIGH (ref 211–946)
WBC: 10.9 10*3/uL — ABNORMAL HIGH (ref 3.4–10.8)

## 2016-01-06 LAB — VITAMIN D 25 HYDROXY (VIT D DEFICIENCY, FRACTURES): Vit D, 25-Hydroxy: 42.9 ng/mL (ref 30.0–100.0)

## 2016-01-06 LAB — LIPID PANEL
CHOLESTEROL TOTAL: 141 mg/dL (ref 100–199)
Chol/HDL Ratio: 2.5 ratio units (ref 0.0–4.4)
HDL: 57 mg/dL (ref >39)
LDL CALC: 66 mg/dL (ref 0–99)
Triglycerides: 89 mg/dL (ref 0–149)
VLDL Cholesterol Cal: 18 mg/dL (ref 5–40)

## 2016-01-06 LAB — THYROID PANEL WITH TSH
Free Thyroxine Index: 1.6 (ref 1.2–4.9)
T3 Uptake Ratio: 26 % (ref 24–39)
T4 TOTAL: 6.1 ug/dL (ref 4.5–12.0)
TSH: 1.61 u[IU]/mL (ref 0.450–4.500)

## 2016-01-08 ENCOUNTER — Other Ambulatory Visit: Payer: Self-pay | Admitting: Family

## 2016-01-08 DIAGNOSIS — S22000A Wedge compression fracture of unspecified thoracic vertebra, initial encounter for closed fracture: Secondary | ICD-10-CM

## 2016-01-08 NOTE — Addendum Note (Signed)
Addended by: Evelina Dun A on: 01/08/2016 04:16 PM   Modules accepted: Level of Service

## 2016-01-09 LAB — PAP IG W/ RFLX HPV ASCU: PAP SMEAR COMMENT: 0

## 2016-01-18 ENCOUNTER — Ambulatory Visit: Payer: Self-pay | Admitting: Pharmacist

## 2016-01-22 ENCOUNTER — Ambulatory Visit: Payer: Self-pay | Admitting: Pharmacist

## 2016-01-23 ENCOUNTER — Encounter: Payer: Self-pay | Admitting: Family

## 2016-01-24 ENCOUNTER — Ambulatory Visit (INDEPENDENT_AMBULATORY_CARE_PROVIDER_SITE_OTHER): Payer: Medicare Other | Admitting: Pharmacist

## 2016-01-24 ENCOUNTER — Ambulatory Visit: Payer: Medicare Other

## 2016-01-24 DIAGNOSIS — M810 Age-related osteoporosis without current pathological fracture: Secondary | ICD-10-CM

## 2016-01-24 MED ORDER — ALENDRONATE SODIUM 70 MG PO TABS: 70.0000 mg | ORAL_TABLET | ORAL | Status: DC

## 2016-01-24 NOTE — Patient Instructions (Signed)
Fall Prevention in the Home  Falls can cause injuries and can affect people from all age groups. There are many simple things that you can do to make your home safe and to help prevent falls. WHAT CAN I DO ON THE OUTSIDE OF MY HOME?  Regularly repair the edges of walkways and driveways and fix any cracks.  Remove high doorway thresholds.  Trim any shrubbery on the main path into your home.  Use bright outdoor lighting.  Clear walkways of debris and clutter, including tools and rocks.  Regularly check that handrails are securely fastened and in good repair. Both sides of any steps should have handrails.  Install guardrails along the edges of any raised decks or porches.  Have leaves, snow, and ice cleared regularly.  Use sand or salt on walkways during winter months.  In the garage, clean up any spills right away, including grease or oil spills. WHAT CAN I DO IN THE BATHROOM?  Use night lights.  Install grab bars by the toilet and in the tub and shower. Do not use towel bars as grab bars.  Use non-skid mats or decals on the floor of the tub or shower.  If you need to sit down while you are in the shower, use a plastic, non-slip stool..  Keep the floor dry. Immediately clean up any water that spills on the floor.  Remove soap buildup in the tub or shower on a regular basis.  Attach bath mats securely with double-sided non-slip rug tape.  Remove throw rugs and other tripping hazards from the floor. WHAT CAN I DO IN THE BEDROOM?  Use night lights.  Make sure that a bedside light is easy to reach.  Do not use oversized bedding that drapes onto the floor.  Have a firm chair that has side arms to use for getting dressed.  Remove throw rugs and other tripping hazards from the floor. WHAT CAN I DO IN THE KITCHEN?   Clean up any spills right away.  Avoid walking on wet floors.  Place frequently used items in easy-to-reach places.  If you need to reach for something  above you, use a sturdy step stool that has a grab bar.  Keep electrical cables out of the way.  Do not use floor polish or wax that makes floors slippery. If you have to use wax, make sure that it is non-skid floor wax.  Remove throw rugs and other tripping hazards from the floor. WHAT CAN I DO IN THE STAIRWAYS?  Do not leave any items on the stairs.  Make sure that there are handrails on both sides of the stairs. Fix handrails that are broken or loose. Make sure that handrails are as long as the stairways.  Check any carpeting to make sure that it is firmly attached to the stairs. Fix any carpet that is loose or worn.  Avoid having throw rugs at the top or bottom of stairways, or secure the rugs with carpet tape to prevent them from moving.  Make sure that you have a light switch at the top of the stairs and the bottom of the stairs. If you do not have them, have them installed. WHAT ARE SOME OTHER FALL PREVENTION TIPS?  Wear closed-toe shoes that fit well and support your feet. Wear shoes that have rubber soles or low heels.  When you use a stepladder, make sure that it is completely opened and that the sides are firmly locked. Have someone hold the ladder while you   are using it. Do not climb a closed stepladder.  Add color or contrast paint or tape to grab bars and handrails in your home. Place contrasting color strips on the first and last steps.  Use mobility aids as needed, such as canes, walkers, scooters, and crutches.  Turn on lights if it is dark. Replace any light bulbs that burn out.  Set up furniture so that there are clear paths. Keep the furniture in the same spot.  Fix any uneven floor surfaces.  Choose a carpet design that does not hide the edge of steps of a stairway.  Be aware of any and all pets.  Review your medicines with your healthcare provider. Some medicines can cause dizziness or changes in blood pressure, which increase your risk of falling. Talk  with your health care provider about other ways that you can decrease your risk of falls. This may include working with a physical therapist or trainer to improve your strength, balance, and endurance.   This information is not intended to replace advice given to you by your health care provider. Make sure you discuss any questions you have with your health care provider.   Document Released: 07/26/2002 Document Revised: 12/20/2014 Document Reviewed: 09/09/2014 Elsevier Interactive Patient Education 2016 [employer]   Calcium & Vitamin D: The Facts  Why is calcium and vitamin D consumption important? Calcium: . Most Americans do not consume adequate amounts of calcium! Calcium is required for proper muscle function, nerve communication, bone support, and many other functions in the body.  . The body uses bones as a source of calcium. Bones 'remodel' themselves continuously - the body constantly breaks bone down to release calcium and rebuilds bones by replacing calcium in the bone later.  . As we get older, the rate of bone breakdown occurs faster than bone rebuilding which could lead to osteopenia, osteoporosis, and possible fractures.   Vitamin D: . People naturally make vitamin D in the body when sunlight hits the skin and triggers a process that leads to vitamin D production. This natural vitamin D production requires about 10-15 minutes of sun exposure on the hands, arms, and face at least 2-3 times per week. However, due to decreased sun exposure and the use of sunscreen, most people will need to get additional vitamin D from foods or supplements. Your doctor can measure your body's vitamin D level through a simple blood test to determine your daily vitamin D needs.  . Vitamin D is used to help the body absorb calcium, maintain bone health, help the immune system, and reduce inflammation. It also plays a role in muscle performance, balance and risk of falling.  . Vitamin D deficiency can  lead to osteomalacia or softening of the bones, bone pain, and muscle weakness.   The recommended daily allowance of Calcium and Vitamin D varies for different age groups. Age group Calcium (mg) Vitamin D (IU)  Females and Males: Age 19-50 1000 mg 600 IU  Females: Age 51- 70 1200 mg 600 IU  Males: Age 51-70 1000 mg 600 IU  Females and Males: Age 71+ 1200 mg 800 IU  Pregnant/lactating Females age 19-50 1000 mg 600 IU   How much Calcium do you get in your diet? Calcium Intake # of servings per day  Total calcium (mg)  Skim milk, 2% milk (1 cup) _________ x 300 mg   Yogurt (1 small container) _________ x 200 mg   Cheese (1oz) _________ x 200 mg   Cottage Cheese (  1 cup)             ________ x 150 mg   Almond milk (1 cup) _________ x 450 mg   Fortified Orange Juice (1 cup) _________ x 300 mg   Broccoli or spinach ( 1 cup) _________ x 100 mg   Salmon (3 oz) _________ x 150 mg    Almonds (1/4 cup) _______ x 90 mg      How do we get Calcium and Vitamin D in our diet? Calcium: . Obtaining calcium from the diet is the most preferred way to reach the recommended daily goal. If this goal is not reached through diet, calcium supplements are available.  . Calcium is found in many foods including: dairy products, dark leafy vegetables (like broccoli, kale, and spinach), fish, and fortified products like juices and cereals.  . The food label will have a %DV (percent daily value) listed showing the amount of calcium per serving. To determine the total mg per serving, simply replace the % with zero (0).  For example, Almond Breeze almond milk contains 45% DV of calcium or 450mg per 1 cup.  . You can increase the amount of calcium in your diet by using more calcium products in your daily meals. Use yogurt and fruit to make smoothies or use yogurt to top baked potatoes or make whipped potatoes. Sprinkle low fat cheese onto salads or into egg white omelets. You can even add non-fat dry milk powder (300mg  calcium per 1/3 cup) to hot cereals, meat loaf, soups, or potatoes.  . Calcium supplements come in many forms including tablets, chewables, and gummies. Be sure to read the label to determine the correct number of tablets per serving and whether or not to take the supplement with food.  . Calcium carbonate products (Oscal, Caltrate, and Viactiv) are generally better absorbed when taken with food while calcium citrate products like Citracal can be taken with or without food.  . The body can only absorb about 600 mg of calcium at one time. It is recommended to take calcium supplements in small amounts several times per day.  However, taking it all at once is better than not taking it at all. . Increasing your intake of calcium is essential for bone health, but may also lead to some side effects like constipation, increased gas, bloating or abdominal cramping. To help reduce these side effects, start with 1 tablet per day and slowly increase your intake of the supplement to the recommended doses. It is also recommended that you drink plenty of water each day. Vitamin D: . Very few foods naturally contain vitamin D. However, it is found in saltwater fish (like tuna, salmon and mackerel), beef liver, egg yolks, cheese and vitamin D fortified foods (like yogurt, cereals, orange juice and milk) . The amount of vitamin D in each food or product is listed as %DV on the product label. To determine the total amount of vitamin D per serving, drop the % sign and multiply the number by 4. For example, 1 cup of Almond Breeze almond milk contains 25% DV vitamin D or 100 IU per serving (25 x 4 =100). . Vitamin D is also found in multivitamins and supplements and may be listed as ergocalciferol (vitamin D2) or cholecalciferol (vitamin D3). Each of these forms of vitamin D are equivalent and the daily recommended intake will vary based on your age and the vitamin D levels in your body. Follow your doctor's recommendation for  vitamin D   intake.        

## 2016-01-24 NOTE — Progress Notes (Signed)
Patient ID: Kristy Moore, female   DOB: 07/05/1961, 55 y.o.   MRN: [id number]  Discussed DEXA results.  Showed ostoeporosis.  Patient to continue calcium supplement QD and calcium rich foods - goal is 1200mg  daily Fall prevention Start alendroante 70mg  take 1 tablet weekly.  Triaged to [facility] for dizziness/ nausea / edema

## 2016-01-29 ENCOUNTER — Encounter: Payer: Self-pay | Admitting: Family

## 2016-01-29 ENCOUNTER — Ambulatory Visit (INDEPENDENT_AMBULATORY_CARE_PROVIDER_SITE_OTHER): Payer: Medicare Other | Admitting: Family

## 2016-01-29 ENCOUNTER — Telehealth: Payer: Self-pay | Admitting: Family

## 2016-01-29 VITALS — BP 136/83 | HR 101 | Temp 97.7°F | Ht 64.0 in | Wt 128.6 lb

## 2016-01-29 DIAGNOSIS — Z79899 Other long term (current) drug therapy: Secondary | ICD-10-CM

## 2016-01-29 DIAGNOSIS — S22000D Wedge compression fracture of unspecified thoracic vertebra, subsequent encounter for fracture with routine healing: Secondary | ICD-10-CM | POA: Diagnosis not present

## 2016-01-29 DIAGNOSIS — J449 Chronic obstructive pulmonary disease, unspecified: Secondary | ICD-10-CM

## 2016-01-29 DIAGNOSIS — M549 Dorsalgia, unspecified: Secondary | ICD-10-CM

## 2016-01-29 DIAGNOSIS — S22000A Wedge compression fracture of unspecified thoracic vertebra, initial encounter for closed fracture: Secondary | ICD-10-CM | POA: Insufficient documentation

## 2016-01-29 DIAGNOSIS — K219 Gastro-esophageal reflux disease without esophagitis: Secondary | ICD-10-CM

## 2016-01-29 DIAGNOSIS — G8929 Other chronic pain: Secondary | ICD-10-CM | POA: Diagnosis not present

## 2016-01-29 DIAGNOSIS — F1120 Opioid dependence, uncomplicated: Secondary | ICD-10-CM | POA: Diagnosis not present

## 2016-01-29 DIAGNOSIS — J432 Centrilobular emphysema: Secondary | ICD-10-CM

## 2016-01-29 DIAGNOSIS — Z0289 Encounter for other administrative examinations: Secondary | ICD-10-CM | POA: Insufficient documentation

## 2016-01-29 MED ORDER — TRAMADOL HCL 50 MG PO TABS: 100.0000 mg | ORAL_TABLET | Freq: Two times a day (BID) | ORAL | Status: DC | PRN

## 2016-01-29 NOTE — Patient Instructions (Signed)

## 2016-01-29 NOTE — Progress Notes (Signed)
Subjective:    Patient ID: Kristy Moore, female    DOB: [birth year], 55 y.o.   MRN: [id number]  Back Pain This is a chronic problem. The current episode started more than 1 year ago. The problem occurs constantly. The problem is unchanged. The pain is present in the thoracic spine and lumbar spine. The quality of the pain is described as aching. The pain is at a severity of 10/10. The pain is moderate. The symptoms are aggravated by bending, standing and twisting. Associated symptoms include leg pain and weakness. Pertinent negatives include no bladder incontinence, bowel incontinence, dysuria, fever, headaches, numbness or weight loss. She has tried NSAIDs, bed rest and analgesics for the symptoms. The treatment provided mild relief.   [place] Controlled Substance Abuse database reviewed- Yes  Depression screen [facility] 2/9 01/29/2016 01/05/2016 09/28/2015 11/23/2014 04/14/2014  Decreased Interest 0 (No Data) 0 0 0  Down, Depressed, Hopeless 0 1 0 0 -  PHQ - 2 Score 0 1 0 0 0    GAD 7 : Generalized Anxiety Score 01/29/2016  Nervous, Anxious, on Edge 0  Control/stop worrying 1  Worry too much - different things 0  Trouble relaxing 3  Restless 0  Easily annoyed or irritable 0  Afraid - awful might happen 0  Total GAD 7 Score 4  Anxiety Difficulty Not difficult at all       [facility] drug screen performed- Yes  SOAPP  0= never  1= seldom  2=sometimes  3= often  4= very often  How often do you have mood swings? 1 How often do you smoke a cigarette within an hour after waling up? 2 How often have you taken medication other than the way that it was prescribed?0 How often have you used illegal drugs in the past 5 years? 0 How often, in your lifetime, have you had legal problems or been arrested? 0  Score 3  Alcohol Audit - How often during the last year have found that you: 0-Never   1- Less than monthly   2- Monthly     3-Weekly     4-daily or almost daily  - found that you  were not able to stop drinking once you started- 0 -failed to do what was normally expected of you because of drinking- 0 -needed a first drink in the morning- 0 -had a feeling of guilt or remorse after drinking- 0 -are/were unable to remember what happened the night before because of your drinking- 0  0- NO   2- yes but not in last year  4- yes during last year -Have you or someone else been injured because of your drinking- 0 - Has anyone been concerned about your drinking or suggested you cut down- 0        TOTAL- 0  ( 0-7- alcohol education, 8-15- simple advice, 16-19 simple advice plus counseling, 20-40 referral for evaluation and treatment 0   Designated Pharmacy- [facility] [place]  Pain assessment: Cause of pain- Chronic back pain, compression fracuture Pain on scale of 1-10- 10 Frequency- constant What increases pain-coughing, bending What makes pain Better-Rest and pain medication   Pain management agreement reviewed and signed- Yes     Review of Systems  Constitutional: Negative for fever and weight loss.  HENT: Negative.   Eyes: Negative.   Respiratory: Negative.  Negative for shortness of breath.   Cardiovascular: Negative.  Negative for palpitations.  Gastrointestinal: Negative.  Negative for bowel incontinence.  Endocrine: Negative.  Genitourinary: Negative.  Negative for bladder incontinence and dysuria.  Musculoskeletal: Positive for back pain.  Neurological: Positive for weakness. Negative for numbness and headaches.  Hematological: Negative.   Psychiatric/Behavioral: Negative.   All other systems reviewed and are negative.      Objective:   Physical Exam  Constitutional: She is oriented to person, place, and time. She appears well-developed and well-nourished. No distress.  HENT:  Head: Normocephalic and atraumatic.  Eyes: Pupils are equal, round, and reactive to light.  Neck: Normal range of motion. Neck supple. No thyromegaly present.    Cardiovascular: Normal rate, regular rhythm, normal heart sounds and intact distal pulses.   No murmur heard. Pulmonary/Chest: Effort normal. No respiratory distress. She has wheezes.  Abdominal: Soft. Bowel sounds are normal. She exhibits no distension. There is no tenderness.  Musculoskeletal: She exhibits no edema or tenderness.  Positive Straight leg raise   Neurological: She is alert and oriented to person, place, and time.  Skin: Skin is warm and dry.  Psychiatric: She has a normal mood and affect. Her behavior is normal. Judgment and thought content normal.  Vitals reviewed.   BP 136/83 mmHg  Pulse 101  Temp(Src) 97.7 F (36.5 C) (Oral)  Ht 5\' 4"  (1.626 m)  Wt 128 lb 9.6 oz (58.333 kg)  BMI 22.06 kg/m2  LMP 09/21/2003       Assessment & Plan:  1. Chronic back pain - ToxASSURE Select 13 (MW), Urine - traMADol (ULTRAM) 50 MG tablet; Take 2 tablets (100 mg total) by mouth every 12 (twelve) hours as needed.  Dispense: 120 tablet; Refill: 2  2. Thoracic compression fracture, with routine healing, subsequent encounter - ToxASSURE Select 13 (MW), Urine - traMADol (ULTRAM) 50 MG tablet; Take 2 tablets (100 mg total) by mouth every 12 (twelve) hours as needed.  Dispense: 120 tablet; Refill: 2  3. Pain medication agreement signed - ToxASSURE Select 13 (MW), Urine - traMADol (ULTRAM) 50 MG tablet; Take 2 tablets (100 mg total) by mouth every 12 (twelve) hours as needed.  Dispense: 120 tablet; Refill: 2  4. Uncomplicated opioid dependence ([place]) - ToxASSURE Select 13 (MW), Urine - traMADol (ULTRAM) 50 MG tablet; Take 2 tablets (100 mg total) by mouth every 12 (twelve) hours as needed.  Dispense: 120 tablet; Refill: 2   Continue all meds, keep appt with ortho Labs pending Health Maintenance reviewed Diet and exercise encouraged RTO 3 months  Evelina Dun, FNP

## 2016-01-30 MED ORDER — ALENDRONATE SODIUM 70 MG PO TABS: 70.0000 mg | ORAL_TABLET | ORAL | Status: AC

## 2016-01-30 MED ORDER — BUDESONIDE-FORMOTEROL FUMARATE 160-4.5 MCG/ACT IN AERO: INHALATION_SPRAY | RESPIRATORY_TRACT | Status: DC

## 2016-01-30 MED ORDER — CYCLOBENZAPRINE HCL 5 MG PO TABS: 5.0000 mg | ORAL_TABLET | Freq: Three times a day (TID) | ORAL | Status: DC | PRN

## 2016-01-30 MED ORDER — OMEPRAZOLE 40 MG PO CPDR: 40.0000 mg | DELAYED_RELEASE_CAPSULE | Freq: Every day | ORAL | Status: DC

## 2016-01-30 MED ORDER — CITALOPRAM HYDROBROMIDE 40 MG PO TABS: ORAL_TABLET | ORAL | Status: AC

## 2016-01-30 MED ORDER — TIOTROPIUM BROMIDE MONOHYDRATE 18 MCG IN CAPS: 18.0000 ug | ORAL_CAPSULE | Freq: Every day | RESPIRATORY_TRACT | Status: DC

## 2016-01-30 MED ORDER — AMITRIPTYLINE HCL 25 MG PO TABS: ORAL_TABLET | ORAL | Status: DC

## 2016-01-30 MED ORDER — NORTRIPTYLINE HCL 25 MG PO CAPS: ORAL_CAPSULE | ORAL | Status: DC

## 2016-01-30 NOTE — Telephone Encounter (Signed)
Prescriptions sent to pharmacy

## 2016-02-05 LAB — TOXASSURE SELECT 13 (MW), URINE: PDF: 0

## 2016-03-25 DIAGNOSIS — Z8701 Personal history of pneumonia (recurrent): Secondary | ICD-10-CM | POA: Diagnosis not present

## 2016-03-25 DIAGNOSIS — G9340 Encephalopathy, unspecified: Secondary | ICD-10-CM | POA: Diagnosis not present

## 2016-03-25 DIAGNOSIS — E876 Hypokalemia: Secondary | ICD-10-CM | POA: Diagnosis present

## 2016-03-25 DIAGNOSIS — Z8673 Personal history of transient ischemic attack (TIA), and cerebral infarction without residual deficits: Secondary | ICD-10-CM | POA: Diagnosis not present

## 2016-03-25 DIAGNOSIS — E872 Acidosis: Secondary | ICD-10-CM | POA: Diagnosis present

## 2016-03-25 DIAGNOSIS — R0902 Hypoxemia: Secondary | ICD-10-CM | POA: Diagnosis not present

## 2016-03-25 DIAGNOSIS — I959 Hypotension, unspecified: Secondary | ICD-10-CM | POA: Diagnosis not present

## 2016-03-25 DIAGNOSIS — J9602 Acute respiratory failure with hypercapnia: Secondary | ICD-10-CM | POA: Diagnosis not present

## 2016-03-25 DIAGNOSIS — R2241 Localized swelling, mass and lump, right lower limb: Secondary | ICD-10-CM | POA: Diagnosis not present

## 2016-03-25 DIAGNOSIS — J9621 Acute and chronic respiratory failure with hypoxia: Secondary | ICD-10-CM | POA: Diagnosis present

## 2016-03-25 DIAGNOSIS — R402340 Coma scale, best motor response, flexion withdrawal, unspecified time: Secondary | ICD-10-CM | POA: Diagnosis not present

## 2016-03-25 DIAGNOSIS — K219 Gastro-esophageal reflux disease without esophagitis: Secondary | ICD-10-CM | POA: Diagnosis present

## 2016-03-25 DIAGNOSIS — Z9981 Dependence on supplemental oxygen: Secondary | ICD-10-CM | POA: Diagnosis not present

## 2016-03-25 DIAGNOSIS — J9622 Acute and chronic respiratory failure with hypercapnia: Secondary | ICD-10-CM | POA: Diagnosis present

## 2016-03-25 DIAGNOSIS — G8929 Other chronic pain: Secondary | ICD-10-CM | POA: Diagnosis present

## 2016-03-25 DIAGNOSIS — R918 Other nonspecific abnormal finding of lung field: Secondary | ICD-10-CM | POA: Diagnosis not present

## 2016-03-25 DIAGNOSIS — M549 Dorsalgia, unspecified: Secondary | ICD-10-CM | POA: Diagnosis not present

## 2016-03-25 DIAGNOSIS — G9349 Other encephalopathy: Secondary | ICD-10-CM | POA: Diagnosis present

## 2016-03-25 DIAGNOSIS — R402120 Coma scale, eyes open, to pain, unspecified time: Secondary | ICD-10-CM | POA: Diagnosis not present

## 2016-03-25 DIAGNOSIS — T39095A Adverse effect of salicylates, initial encounter: Secondary | ICD-10-CM | POA: Diagnosis not present

## 2016-03-25 DIAGNOSIS — R4020 Unspecified coma: Secondary | ICD-10-CM | POA: Diagnosis not present

## 2016-03-25 DIAGNOSIS — J690 Pneumonitis due to inhalation of food and vomit: Secondary | ICD-10-CM | POA: Diagnosis present

## 2016-03-25 DIAGNOSIS — A419 Sepsis, unspecified organism: Secondary | ICD-10-CM | POA: Diagnosis present

## 2016-03-25 DIAGNOSIS — M67431 Ganglion, right wrist: Secondary | ICD-10-CM | POA: Diagnosis present

## 2016-03-25 DIAGNOSIS — R6521 Severe sepsis with septic shock: Secondary | ICD-10-CM | POA: Diagnosis present

## 2016-03-25 DIAGNOSIS — Z8614 Personal history of Methicillin resistant Staphylococcus aureus infection: Secondary | ICD-10-CM | POA: Diagnosis not present

## 2016-03-25 DIAGNOSIS — J9600 Acute respiratory failure, unspecified whether with hypoxia or hypercapnia: Secondary | ICD-10-CM | POA: Diagnosis not present

## 2016-03-25 DIAGNOSIS — J441 Chronic obstructive pulmonary disease with (acute) exacerbation: Secondary | ICD-10-CM | POA: Diagnosis present

## 2016-03-25 DIAGNOSIS — J431 Panlobular emphysema: Secondary | ICD-10-CM | POA: Diagnosis not present

## 2016-03-25 DIAGNOSIS — R4182 Altered mental status, unspecified: Secondary | ICD-10-CM | POA: Diagnosis not present

## 2016-03-25 DIAGNOSIS — J189 Pneumonia, unspecified organism: Secondary | ICD-10-CM | POA: Diagnosis not present

## 2016-03-25 DIAGNOSIS — R1312 Dysphagia, oropharyngeal phase: Secondary | ICD-10-CM | POA: Diagnosis present

## 2016-03-25 DIAGNOSIS — R402210 Coma scale, best verbal response, none, unspecified time: Secondary | ICD-10-CM | POA: Diagnosis not present

## 2016-03-25 DIAGNOSIS — R739 Hyperglycemia, unspecified: Secondary | ICD-10-CM | POA: Diagnosis not present

## 2016-03-25 DIAGNOSIS — R000 Tachycardia, unspecified: Secondary | ICD-10-CM | POA: Diagnosis not present

## 2016-03-25 DIAGNOSIS — F418 Other specified anxiety disorders: Secondary | ICD-10-CM | POA: Diagnosis present

## 2016-03-25 DIAGNOSIS — R400 Somnolence: Secondary | ICD-10-CM | POA: Diagnosis not present

## 2016-03-25 DIAGNOSIS — J181 Lobar pneumonia, unspecified organism: Secondary | ICD-10-CM | POA: Diagnosis not present

## 2016-03-25 DIAGNOSIS — M545 Low back pain: Secondary | ICD-10-CM | POA: Diagnosis present

## 2016-03-25 DIAGNOSIS — F17210 Nicotine dependence, cigarettes, uncomplicated: Secondary | ICD-10-CM | POA: Diagnosis not present

## 2016-03-25 DIAGNOSIS — R092 Respiratory arrest: Secondary | ICD-10-CM | POA: Diagnosis not present

## 2016-03-25 DIAGNOSIS — T39091A Poisoning by salicylates, accidental (unintentional), initial encounter: Secondary | ICD-10-CM | POA: Diagnosis not present

## 2016-03-25 DIAGNOSIS — Z716 Tobacco abuse counseling: Secondary | ICD-10-CM | POA: Diagnosis not present

## 2016-03-30 DIAGNOSIS — J181 Lobar pneumonia, unspecified organism: Secondary | ICD-10-CM | POA: Diagnosis not present

## 2016-03-30 DIAGNOSIS — I959 Hypotension, unspecified: Secondary | ICD-10-CM | POA: Diagnosis not present

## 2016-03-30 DIAGNOSIS — R4020 Unspecified coma: Secondary | ICD-10-CM | POA: Diagnosis not present

## 2016-04-02 DIAGNOSIS — M545 Low back pain: Secondary | ICD-10-CM | POA: Diagnosis not present

## 2016-04-02 DIAGNOSIS — J441 Chronic obstructive pulmonary disease with (acute) exacerbation: Secondary | ICD-10-CM | POA: Diagnosis not present

## 2016-04-02 DIAGNOSIS — K219 Gastro-esophageal reflux disease without esophagitis: Secondary | ICD-10-CM | POA: Diagnosis not present

## 2016-04-02 DIAGNOSIS — I69391 Dysphagia following cerebral infarction: Secondary | ICD-10-CM | POA: Diagnosis not present

## 2016-04-02 DIAGNOSIS — M21372 Foot drop, left foot: Secondary | ICD-10-CM | POA: Diagnosis not present

## 2016-04-02 DIAGNOSIS — R1312 Dysphagia, oropharyngeal phase: Secondary | ICD-10-CM | POA: Diagnosis not present

## 2016-04-03 DIAGNOSIS — K219 Gastro-esophageal reflux disease without esophagitis: Secondary | ICD-10-CM | POA: Diagnosis not present

## 2016-04-03 DIAGNOSIS — I69391 Dysphagia following cerebral infarction: Secondary | ICD-10-CM | POA: Diagnosis not present

## 2016-04-03 DIAGNOSIS — J441 Chronic obstructive pulmonary disease with (acute) exacerbation: Secondary | ICD-10-CM | POA: Diagnosis not present

## 2016-04-03 DIAGNOSIS — M21372 Foot drop, left foot: Secondary | ICD-10-CM | POA: Diagnosis not present

## 2016-04-03 DIAGNOSIS — M545 Low back pain: Secondary | ICD-10-CM | POA: Diagnosis not present

## 2016-04-03 DIAGNOSIS — R1312 Dysphagia, oropharyngeal phase: Secondary | ICD-10-CM | POA: Diagnosis not present

## 2016-04-04 DIAGNOSIS — M545 Low back pain: Secondary | ICD-10-CM | POA: Diagnosis not present

## 2016-04-04 DIAGNOSIS — R1312 Dysphagia, oropharyngeal phase: Secondary | ICD-10-CM | POA: Diagnosis not present

## 2016-04-04 DIAGNOSIS — K219 Gastro-esophageal reflux disease without esophagitis: Secondary | ICD-10-CM | POA: Diagnosis not present

## 2016-04-04 DIAGNOSIS — I69391 Dysphagia following cerebral infarction: Secondary | ICD-10-CM | POA: Diagnosis not present

## 2016-04-04 DIAGNOSIS — M21372 Foot drop, left foot: Secondary | ICD-10-CM | POA: Diagnosis not present

## 2016-04-04 DIAGNOSIS — J441 Chronic obstructive pulmonary disease with (acute) exacerbation: Secondary | ICD-10-CM | POA: Diagnosis not present

## 2016-04-05 ENCOUNTER — Ambulatory Visit (INDEPENDENT_AMBULATORY_CARE_PROVIDER_SITE_OTHER): Payer: Medicare Other | Admitting: Family

## 2016-04-05 ENCOUNTER — Encounter: Payer: Self-pay | Admitting: Family

## 2016-04-05 VITALS — BP 115/77 | HR 82 | Temp 97.8°F | Ht 64.0 in | Wt 135.2 lb

## 2016-04-05 DIAGNOSIS — J449 Chronic obstructive pulmonary disease, unspecified: Secondary | ICD-10-CM | POA: Diagnosis not present

## 2016-04-05 DIAGNOSIS — G8929 Other chronic pain: Secondary | ICD-10-CM

## 2016-04-05 DIAGNOSIS — J9620 Acute and chronic respiratory failure, unspecified whether with hypoxia or hypercapnia: Secondary | ICD-10-CM | POA: Diagnosis not present

## 2016-04-05 DIAGNOSIS — A419 Sepsis, unspecified organism: Secondary | ICD-10-CM | POA: Diagnosis not present

## 2016-04-05 DIAGNOSIS — Z09 Encounter for follow-up examination after completed treatment for conditions other than malignant neoplasm: Secondary | ICD-10-CM

## 2016-04-05 DIAGNOSIS — G9340 Encephalopathy, unspecified: Secondary | ICD-10-CM | POA: Diagnosis not present

## 2016-04-05 DIAGNOSIS — M549 Dorsalgia, unspecified: Secondary | ICD-10-CM | POA: Diagnosis not present

## 2016-04-05 DIAGNOSIS — J189 Pneumonia, unspecified organism: Secondary | ICD-10-CM | POA: Diagnosis not present

## 2016-04-05 NOTE — Progress Notes (Signed)
   Subjective:    Patient ID: Kristy Moore, female    DOB: 28-Sep-1960, 55 y.o.   MRN: [id number]  HPI PT presents to the office today for hospital follow up. PT went to the ED on 03/25/16 and was discharged on 03/31/16. Pt was unresponsive states with acute encephalopathy, acute on chronic hypoxic and hypercapnic respiratory failure likely secondary to acute COPD exacerbation and community-acquired pneumonia, shock. PT was intubated on 03/25/16 and self extubated on 08/08. PT is currently on 3 L of continuous oxygen. PT reports her breathing better, PT denies any cough since quitting smoking. Pt states she has not smoked since her admission. Pt reports SOB if she walks long distances, but if sitting still her breathing is good. PT continues with home health twice a week and physical therapy once a week.   Pt is currently living with her sister in [place], [place] until she gets strength enough to live by herself.  Pt is requesting portable oxygen tank.   [facility] notes were reviewed.   Review of Systems  Respiratory: Positive for shortness of breath (at tmes).   Cardiovascular: Negative for chest pain.  Musculoskeletal: Positive for arthralgias and back pain.  All other systems reviewed and are negative.      Objective:   Physical Exam  Constitutional: She is oriented to person, place, and time. She appears well-developed and well-nourished. No distress.  HENT:  Head: Normocephalic and atraumatic.  Eyes: Pupils are equal, round, and reactive to light.  Neck: Normal range of motion. Neck supple. No thyromegaly present.  Cardiovascular: Normal rate, regular rhythm, normal heart sounds and intact distal pulses.   No murmur heard. Pulmonary/Chest: Effort normal and breath sounds normal. No respiratory distress. She has no wheezes.  Abdominal: Soft. Bowel sounds are normal. She exhibits no distension. There is no tenderness.  Musculoskeletal: Normal range of motion. She exhibits no edema  or tenderness.  Neurological: She is alert and oriented to person, place, and time.  Skin: Skin is warm and dry.  Psychiatric: She has a normal mood and affect. Her behavior is normal. Judgment and thought content normal.  Vitals reviewed.     BP 115/77   Pulse 78   Temp 97.8 F (36.6 C) (Oral)   Ht '5\' 4"'$  (1.626 m)   Wt 135 lb 3.2 oz (61.3 kg)   LMP 09/21/2003   SpO2 97% Comment: Patient on 3 liter oxygen at all times. 97 % today  BMI 23.21 kg/m      Assessment & Plan:  1. CAP (community acquired pneumonia) - CMP14+EGFR - CBC with Differential/Platelet - PR OXYGEN SYSTEM GAS PORTABLE  2. Sepsis, due to unspecified organism ([place]) - CMP14+EGFR - CBC with Differential/Platelet - PR OXYGEN SYSTEM GAS PORTABLE  3. Hospital discharge follow-up - CMP14+EGFR - CBC with Differential/Platelet - PR OXYGEN SYSTEM GAS PORTABLE  4. Acute encephalopathy - CMP14+EGFR - CBC with Differential/Platelet - PR OXYGEN SYSTEM GAS PORTABLE  5. Chronic obstructive pulmonary disease, unspecified COPD type ([place]) - CMP14+EGFR - CBC with Differential/Platelet - PR OXYGEN SYSTEM GAS PORTABLE  6. Acute on chronic respiratory failure, unspecified whether with hypoxia or hypercapnia (HCC) - CMP14+EGFR - CBC with Differential/Platelet - PR OXYGEN SYSTEM GAS PORTABLE  7. Chronic back pain - CMP14+EGFR - CBC with Differential/Platelet - PR OXYGEN SYSTEM GAS PORTABLE-Room air O2 88%    Handicap forms filled  RTO in 1 month  Evelina Dun, FNP

## 2016-04-05 NOTE — Patient Instructions (Signed)
Chronic Obstructive Pulmonary Disease Chronic obstructive pulmonary disease (COPD) is a common lung condition in which airflow from the lungs is limited. COPD is a general term that can be used to describe many different lung problems that limit airflow, including both chronic bronchitis and emphysema. If you have COPD, your lung function will probably never return to normal, but there are measures you can take to improve lung function and make yourself feel better. CAUSES   Smoking (common).  Exposure to secondhand smoke.  Genetic problems.  Chronic inflammatory lung diseases or recurrent infections. SYMPTOMS  Shortness of breath, especially with physical activity.  Deep, persistent (chronic) cough with a large amount of thick mucus.  Wheezing.  Rapid breaths (tachypnea).  Gray or bluish discoloration (cyanosis) of the skin, especially in your fingers, toes, or lips.  Fatigue.  Weight loss.  Frequent infections or episodes when breathing symptoms become much worse (exacerbations).  Chest tightness. DIAGNOSIS Your health care provider will take a medical history and perform a physical examination to diagnose COPD. Additional tests for COPD may include:  Lung (pulmonary) function tests.  Chest X-ray.  CT scan.  Blood tests. TREATMENT  Treatment for COPD may include:  Inhaler and nebulizer medicines. These help manage the symptoms of COPD and make your breathing more comfortable.  Supplemental oxygen. Supplemental oxygen is only helpful if you have a low oxygen level in your blood.  Exercise and physical activity. These are beneficial for nearly all people with COPD.  Lung surgery or transplant.  Nutrition therapy to gain weight, if you are underweight.  Pulmonary rehabilitation. This may involve working with a team of health care providers and specialists, such as respiratory, occupational, and physical therapists. HOME CARE INSTRUCTIONS  Take all medicines  (inhaled or pills) as directed by your health care provider.  Avoid over-the-counter medicines or cough syrups that dry up your airway (such as antihistamines) and slow down the elimination of secretions unless instructed otherwise by your health care provider.  If you are a smoker, the most important thing that you can do is stop smoking. Continuing to smoke will cause further lung damage and breathing trouble. Ask your health care provider for help with quitting smoking. He or she can direct you to community resources or hospitals that provide support.  Avoid exposure to irritants such as smoke, chemicals, and fumes that aggravate your breathing.  Use oxygen therapy and pulmonary rehabilitation if directed by your health care provider. If you require home oxygen therapy, ask your health care provider whether you should purchase a pulse oximeter to measure your oxygen level at home.  Avoid contact with individuals who have a contagious illness.  Avoid extreme temperature and humidity changes.  Eat healthy foods. Eating smaller, more frequent meals and resting before meals may help you maintain your strength.  Stay active, but balance activity with periods of rest. Exercise and physical activity will help you maintain your ability to do things you want to do.  Preventing infection and hospitalization is very important when you have COPD. Make sure to receive all the vaccines your health care provider recommends, especially the pneumococcal and influenza vaccines. Ask your health care provider whether you need a pneumonia vaccine.  Learn and use relaxation techniques to manage stress.  Learn and use controlled breathing techniques as directed by your health care provider. Controlled breathing techniques include:  Pursed lip breathing. Start by breathing in (inhaling) through your nose for 1 second. Then, purse your lips as if you were   going to whistle and breathe out (exhale) through the  pursed lips for 2 seconds.  Diaphragmatic breathing. Start by putting one hand on your abdomen just above your waist. Inhale slowly through your nose. The hand on your abdomen should move out. Then purse your lips and exhale slowly. You should be able to feel the hand on your abdomen moving in as you exhale.  Learn and use controlled coughing to clear mucus from your lungs. Controlled coughing is a series of short, progressive coughs. The steps of controlled coughing are: 1. Lean your head slightly forward. 2. Breathe in deeply using diaphragmatic breathing. 3. Try to hold your breath for 3 seconds. 4. Keep your mouth slightly open while coughing twice. 5. Spit any mucus out into a tissue. 6. Rest and repeat the steps once or twice as needed. SEEK MEDICAL CARE IF:  You are coughing up more mucus than usual.  There is a change in the color or thickness of your mucus.  Your breathing is more labored than usual.  Your breathing is faster than usual. SEEK IMMEDIATE MEDICAL CARE IF:  You have shortness of breath while you are resting.  You have shortness of breath that prevents you from:  Being able to talk.  Performing your usual physical activities.  You have chest pain lasting longer than 5 minutes.  Your skin color is more cyanotic than usual.  You measure low oxygen saturations for longer than 5 minutes with a pulse oximeter. MAKE SURE YOU:  Understand these instructions.  Will watch your condition.  Will get help right away if you are not doing well or get worse.   This information is not intended to replace advice given to you by your health care provider. Make sure you discuss any questions you have with your health care provider.   Document Released: 05/15/2005 Document Revised: 08/26/2014 Document Reviewed: 04/01/2013 Elsevier Interactive Patient Education 2016 Elsevier Inc.  

## 2016-04-06 LAB — CMP14+EGFR
A/G RATIO: 1.5 (ref 1.2–2.2)
ALBUMIN: 3.9 g/dL (ref 3.5–5.5)
ALK PHOS: 81 IU/L (ref 39–117)
ALT: 11 IU/L (ref 0–32)
AST: 17 IU/L (ref 0–40)
BUN / CREAT RATIO: 18 (ref 9–23)
BUN: 11 mg/dL (ref 6–24)
CHLORIDE: 100 mmol/L (ref 96–106)
CO2: 28 mmol/L (ref 18–29)
Calcium: 9.3 mg/dL (ref 8.7–10.2)
Creatinine, Ser: 0.62 mg/dL (ref 0.57–1.00)
GFR calc non Af Amer: 101 mL/min/{1.73_m2} (ref >59)
GFR, EST AFRICAN AMERICAN: 117 mL/min/{1.73_m2} (ref >59)
Globulin, Total: 2.6 g/dL (ref 1.5–4.5)
Glucose: 63 mg/dL — ABNORMAL LOW (ref 65–99)
POTASSIUM: 5.2 mmol/L (ref 3.5–5.2)
Sodium: 142 mmol/L (ref 134–144)
TOTAL PROTEIN: 6.5 g/dL (ref 6.0–8.5)

## 2016-04-06 LAB — CBC WITH DIFFERENTIAL/PLATELET
BASOS ABS: 0.1 10*3/uL (ref 0.0–0.2)
Basos: 1 %
EOS (ABSOLUTE): 0.5 10*3/uL — AB (ref 0.0–0.4)
Eos: 4 %
Hematocrit: 41 % (ref 34.0–46.6)
Hemoglobin: 13 g/dL (ref 11.1–15.9)
Immature Grans (Abs): 0.1 10*3/uL (ref 0.0–0.1)
Immature Granulocytes: 1 %
LYMPHS ABS: 2.5 10*3/uL (ref 0.7–3.1)
Lymphs: 22 %
MCH: 30 pg (ref 26.6–33.0)
MCHC: 31.7 g/dL (ref 31.5–35.7)
MCV: 95 fL (ref 79–97)
Monocytes Absolute: 1.1 10*3/uL — ABNORMAL HIGH (ref 0.1–0.9)
Monocytes: 10 %
NEUTROS ABS: 6.9 10*3/uL (ref 1.4–7.0)
Neutrophils: 62 %
PLATELETS: 348 10*3/uL (ref 150–379)
RBC: 4.33 x10E6/uL (ref 3.77–5.28)
RDW: 14.7 % (ref 12.3–15.4)
WBC: 11.2 10*3/uL — ABNORMAL HIGH (ref 3.4–10.8)

## 2016-04-08 ENCOUNTER — Telehealth: Payer: Self-pay | Admitting: *Deleted

## 2016-04-08 ENCOUNTER — Ambulatory Visit: Payer: Self-pay | Admitting: Family

## 2016-04-08 DIAGNOSIS — J441 Chronic obstructive pulmonary disease with (acute) exacerbation: Secondary | ICD-10-CM | POA: Diagnosis not present

## 2016-04-08 DIAGNOSIS — M21372 Foot drop, left foot: Secondary | ICD-10-CM | POA: Diagnosis not present

## 2016-04-08 DIAGNOSIS — M545 Low back pain: Secondary | ICD-10-CM | POA: Diagnosis not present

## 2016-04-08 DIAGNOSIS — K219 Gastro-esophageal reflux disease without esophagitis: Secondary | ICD-10-CM | POA: Diagnosis not present

## 2016-04-08 DIAGNOSIS — R1312 Dysphagia, oropharyngeal phase: Secondary | ICD-10-CM | POA: Diagnosis not present

## 2016-04-08 DIAGNOSIS — I69391 Dysphagia following cerebral infarction: Secondary | ICD-10-CM | POA: Diagnosis not present

## 2016-04-08 NOTE — Telephone Encounter (Signed)
I spoke with Tiffany - she is a [facility] nurse in the home with Pt right now. She  States that the pt is very lethargic, falling asleep while she is working with her. He VS were ok but she thinks she needs to go back to the hosp.   Recent DX CAP--- I gave advise that she should go back to hosp as well.

## 2016-04-10 DIAGNOSIS — M21372 Foot drop, left foot: Secondary | ICD-10-CM | POA: Diagnosis not present

## 2016-04-10 DIAGNOSIS — M545 Low back pain: Secondary | ICD-10-CM | POA: Diagnosis not present

## 2016-04-10 DIAGNOSIS — J441 Chronic obstructive pulmonary disease with (acute) exacerbation: Secondary | ICD-10-CM | POA: Diagnosis not present

## 2016-04-10 DIAGNOSIS — R1312 Dysphagia, oropharyngeal phase: Secondary | ICD-10-CM | POA: Diagnosis not present

## 2016-04-10 DIAGNOSIS — I69391 Dysphagia following cerebral infarction: Secondary | ICD-10-CM | POA: Diagnosis not present

## 2016-04-10 DIAGNOSIS — K219 Gastro-esophageal reflux disease without esophagitis: Secondary | ICD-10-CM | POA: Diagnosis not present

## 2016-04-12 DIAGNOSIS — M21372 Foot drop, left foot: Secondary | ICD-10-CM | POA: Diagnosis not present

## 2016-04-12 DIAGNOSIS — R1312 Dysphagia, oropharyngeal phase: Secondary | ICD-10-CM | POA: Diagnosis not present

## 2016-04-12 DIAGNOSIS — M545 Low back pain: Secondary | ICD-10-CM | POA: Diagnosis not present

## 2016-04-12 DIAGNOSIS — J441 Chronic obstructive pulmonary disease with (acute) exacerbation: Secondary | ICD-10-CM | POA: Diagnosis not present

## 2016-04-12 DIAGNOSIS — K219 Gastro-esophageal reflux disease without esophagitis: Secondary | ICD-10-CM | POA: Diagnosis not present

## 2016-04-12 DIAGNOSIS — I69391 Dysphagia following cerebral infarction: Secondary | ICD-10-CM | POA: Diagnosis not present

## 2016-04-18 ENCOUNTER — Ambulatory Visit (INDEPENDENT_AMBULATORY_CARE_PROVIDER_SITE_OTHER): Payer: Medicare Other | Admitting: Family Medicine

## 2016-04-18 DIAGNOSIS — I69391 Dysphagia following cerebral infarction: Secondary | ICD-10-CM

## 2016-04-18 DIAGNOSIS — M545 Low back pain: Secondary | ICD-10-CM

## 2016-04-18 DIAGNOSIS — R1312 Dysphagia, oropharyngeal phase: Secondary | ICD-10-CM | POA: Diagnosis not present

## 2016-04-18 DIAGNOSIS — M21372 Foot drop, left foot: Secondary | ICD-10-CM | POA: Diagnosis not present

## 2016-04-18 DIAGNOSIS — K219 Gastro-esophageal reflux disease without esophagitis: Secondary | ICD-10-CM | POA: Diagnosis not present

## 2016-04-18 DIAGNOSIS — J441 Chronic obstructive pulmonary disease with (acute) exacerbation: Secondary | ICD-10-CM

## 2016-04-24 ENCOUNTER — Telehealth: Payer: Self-pay | Admitting: Family

## 2016-04-24 DIAGNOSIS — J441 Chronic obstructive pulmonary disease with (acute) exacerbation: Secondary | ICD-10-CM | POA: Diagnosis not present

## 2016-04-24 DIAGNOSIS — I69391 Dysphagia following cerebral infarction: Secondary | ICD-10-CM | POA: Diagnosis not present

## 2016-04-24 DIAGNOSIS — M21372 Foot drop, left foot: Secondary | ICD-10-CM | POA: Diagnosis not present

## 2016-04-24 DIAGNOSIS — M545 Low back pain: Secondary | ICD-10-CM | POA: Diagnosis not present

## 2016-04-24 DIAGNOSIS — R1312 Dysphagia, oropharyngeal phase: Secondary | ICD-10-CM | POA: Diagnosis not present

## 2016-04-24 DIAGNOSIS — K219 Gastro-esophageal reflux disease without esophagitis: Secondary | ICD-10-CM | POA: Diagnosis not present

## 2016-04-24 NOTE — Telephone Encounter (Signed)
Requested appt scheduled

## 2016-04-25 ENCOUNTER — Encounter: Payer: Self-pay | Admitting: Family

## 2016-04-25 ENCOUNTER — Ambulatory Visit (INDEPENDENT_AMBULATORY_CARE_PROVIDER_SITE_OTHER): Payer: Medicare Other | Admitting: Family

## 2016-04-25 ENCOUNTER — Ambulatory Visit (INDEPENDENT_AMBULATORY_CARE_PROVIDER_SITE_OTHER): Payer: Medicare Other

## 2016-04-25 VITALS — BP 118/83 | HR 100 | Temp 97.3°F | Ht 64.0 in | Wt 133.6 lb

## 2016-04-25 DIAGNOSIS — J449 Chronic obstructive pulmonary disease, unspecified: Secondary | ICD-10-CM | POA: Diagnosis not present

## 2016-04-25 DIAGNOSIS — I69391 Dysphagia following cerebral infarction: Secondary | ICD-10-CM | POA: Diagnosis not present

## 2016-04-25 DIAGNOSIS — R1312 Dysphagia, oropharyngeal phase: Secondary | ICD-10-CM | POA: Diagnosis not present

## 2016-04-25 DIAGNOSIS — Z8701 Personal history of pneumonia (recurrent): Secondary | ICD-10-CM

## 2016-04-25 DIAGNOSIS — R110 Nausea: Secondary | ICD-10-CM | POA: Diagnosis not present

## 2016-04-25 DIAGNOSIS — S22000D Wedge compression fracture of unspecified thoracic vertebra, subsequent encounter for fracture with routine healing: Secondary | ICD-10-CM

## 2016-04-25 DIAGNOSIS — R531 Weakness: Secondary | ICD-10-CM

## 2016-04-25 DIAGNOSIS — Z79899 Other long term (current) drug therapy: Secondary | ICD-10-CM

## 2016-04-25 DIAGNOSIS — F1120 Opioid dependence, uncomplicated: Secondary | ICD-10-CM

## 2016-04-25 DIAGNOSIS — K219 Gastro-esophageal reflux disease without esophagitis: Secondary | ICD-10-CM | POA: Diagnosis not present

## 2016-04-25 DIAGNOSIS — M545 Low back pain: Secondary | ICD-10-CM | POA: Diagnosis not present

## 2016-04-25 DIAGNOSIS — Z0289 Encounter for other administrative examinations: Secondary | ICD-10-CM

## 2016-04-25 DIAGNOSIS — M21372 Foot drop, left foot: Secondary | ICD-10-CM | POA: Diagnosis not present

## 2016-04-25 DIAGNOSIS — G8929 Other chronic pain: Secondary | ICD-10-CM

## 2016-04-25 DIAGNOSIS — M549 Dorsalgia, unspecified: Secondary | ICD-10-CM

## 2016-04-25 DIAGNOSIS — J441 Chronic obstructive pulmonary disease with (acute) exacerbation: Secondary | ICD-10-CM | POA: Diagnosis not present

## 2016-04-25 MED ORDER — TRAMADOL HCL 50 MG PO TABS
100.0000 mg | ORAL_TABLET | Freq: Three times a day (TID) | ORAL | 2 refills | Status: AC | PRN
Start: 2016-04-25 — End: ?

## 2016-04-25 NOTE — Progress Notes (Signed)
Subjective:    Patient ID: Kristy Moore, female    DOB: [birth year], 55 y.o.   MRN: [id number]  HPI Pt presents to the office today with chills, nausea, weakness, dizziness, and pain in left lung that started about a week and half ago. Pt states the pain is intermittent sharp pain of 10 out 10. PT denies any cough since stopping smoking about 2 weeks ago.  Pt denies any dysuria. Pt was admitted to [facility] last month for CAP and was intubated at that time and has COPD. Pt is requesting her pain medication to be increased to 3 times a day. Pt states her back pain is chronic and has increased since we decreased her dose to BID. Pt states her pain is a 10 out 10.   Review of Systems  Constitutional: Positive for appetite change and chills.  HENT: Positive for tinnitus. Negative for ear pain, rhinorrhea and sore throat.   Respiratory: Positive for chest tightness. Negative for cough and shortness of breath.   Cardiovascular: Negative for leg swelling.  Gastrointestinal: Positive for nausea. Negative for constipation, diarrhea and vomiting.  Genitourinary: Negative for dysuria, frequency and urgency.  Musculoskeletal: Positive for back pain.       Objective:   Physical Exam  Constitutional: She is oriented to person, place, and time. She appears well-developed and well-nourished. No distress.  HENT:  Head: Normocephalic and atraumatic.  Right Ear: External ear normal.  Left Ear: External ear normal.  Nasal passage erythemas with mild swelling   Eyes: Pupils are equal, round, and reactive to light.  Cardiovascular: Normal rate, regular rhythm, normal heart sounds and intact distal pulses.   No murmur heard. Pulmonary/Chest: Effort normal. No respiratory distress. She has no wheezes.  Diminished breath sounds   Abdominal: Soft. Bowel sounds are normal. She exhibits no distension. There is no tenderness.  Musculoskeletal: Normal range of motion. She exhibits no edema or tenderness.    Neurological: She is alert and oriented to person, place, and time.  Skin: Skin is warm and dry.  Psychiatric: She has a normal mood and affect. Her behavior is normal. Judgment and thought content normal.  Vitals reviewed.     BP 118/83   Pulse 100   Temp 97.3 F (36.3 C) (Oral)   Ht 5' 4"  (1.626 m)   Wt 133 lb 9.6 oz (60.6 kg)   LMP 09/21/2003   BMI 22.93 kg/m      Assessment & Plan:  1. Weakness - CBC with Differential/Platelet - CMP14+EGFR - DG Chest 2 View; Future  2. Nausea without vomiting - CBC with Differential/Platelet - CMP14+EGFR  3. Hx of bacterial pneumonia - CBC with Differential/Platelet - CMP14+EGFR - DG Chest 2 View; Future  4. Chronic back pain -Increased Ultram to 100 mg TID prn -Discussed only taking medication as needed - traMADol (ULTRAM) 50 MG tablet; Take 2 tablets (100 mg total) by mouth every 8 (eight) hours as needed.  Dispense: 180 tablet; Refill: 2 - ToxASSURE Select 13 (MW), Urine  5. Thoracic compression fracture, with routine healing, subsequent encounter - traMADol (ULTRAM) 50 MG tablet; Take 2 tablets (100 mg total) by mouth every 8 (eight) hours as needed.  Dispense: 180 tablet; Refill: 2 - ToxASSURE Select 13 (MW), Urine  6. Pain medication agreement signed - traMADol (ULTRAM) 50 MG tablet; Take 2 tablets (100 mg total) by mouth every 8 (eight) hours as needed.  Dispense: 180 tablet; Refill: 2 - ToxASSURE Select 13 (MW), Urine  7. Uncomplicated  opioid dependence (HCC) - traMADol (ULTRAM) 50 MG tablet; Take 2 tablets (100 mg total) by mouth every 8 (eight) hours as needed.  Dispense: 180 tablet; Refill: 2 - ToxASSURE Select 13 (MW), Urine  8. Chronic obstructive pulmonary disease, unspecified COPD type ([place])  Encouraged to continue smoking cessation Force fluids [place] Controlled database reviewed: Pt only has received pain medication from me.  RTO prn and keep chronic follow up  Evelina Dun, FNP

## 2016-04-25 NOTE — Patient Instructions (Signed)

## 2016-04-26 LAB — CBC WITH DIFFERENTIAL/PLATELET
BASOS: 1 %
Basophils Absolute: 0.1 10*3/uL (ref 0.0–0.2)
EOS (ABSOLUTE): 0.3 10*3/uL (ref 0.0–0.4)
EOS: 3 %
Hematocrit: 42.4 % (ref 34.0–46.6)
Hemoglobin: 14 g/dL (ref 11.1–15.9)
IMMATURE GRANS (ABS): 0.1 10*3/uL (ref 0.0–0.1)
IMMATURE GRANULOCYTES: 1 %
LYMPHS: 27 %
Lymphocytes Absolute: 2.6 10*3/uL (ref 0.7–3.1)
MCH: 29.9 pg (ref 26.6–33.0)
MCHC: 33 g/dL (ref 31.5–35.7)
MCV: 90 fL (ref 79–97)
MONOS ABS: 1.2 10*3/uL — AB (ref 0.1–0.9)
Monocytes: 12 %
NEUTROS PCT: 56 %
Neutrophils Absolute: 5.4 10*3/uL (ref 1.4–7.0)
PLATELETS: 320 10*3/uL (ref 150–379)
RBC: 4.69 x10E6/uL (ref 3.77–5.28)
RDW: 15.3 % (ref 12.3–15.4)
WBC: 9.6 10*3/uL (ref 3.4–10.8)

## 2016-04-26 LAB — CMP14+EGFR
A/G RATIO: 1.7 (ref 1.2–2.2)
ALBUMIN: 4.2 g/dL (ref 3.5–5.5)
ALT: 8 IU/L (ref 0–32)
AST: 16 IU/L (ref 0–40)
Alkaline Phosphatase: 76 IU/L (ref 39–117)
BUN/Creatinine Ratio: 12 (ref 9–23)
BUN: 8 mg/dL (ref 6–24)
Bilirubin Total: <0.2 mg/dL (ref 0.0–1.2)
CALCIUM: 9.3 mg/dL (ref 8.7–10.2)
CO2: 25 mmol/L (ref 18–29)
Chloride: 105 mmol/L (ref 96–106)
Creatinine, Ser: 0.66 mg/dL (ref 0.57–1.00)
GFR, EST AFRICAN AMERICAN: 114 mL/min/{1.73_m2} (ref >59)
GFR, EST NON AFRICAN AMERICAN: 99 mL/min/{1.73_m2} (ref >59)
GLOBULIN, TOTAL: 2.5 g/dL (ref 1.5–4.5)
Glucose: 78 mg/dL (ref 65–99)
POTASSIUM: 4.3 mmol/L (ref 3.5–5.2)
Sodium: 146 mmol/L — ABNORMAL HIGH (ref 134–144)
TOTAL PROTEIN: 6.7 g/dL (ref 6.0–8.5)

## 2016-04-30 ENCOUNTER — Other Ambulatory Visit: Payer: Self-pay | Admitting: Family

## 2016-05-01 ENCOUNTER — Other Ambulatory Visit: Payer: Self-pay | Admitting: *Deleted

## 2016-05-01 DIAGNOSIS — J441 Chronic obstructive pulmonary disease with (acute) exacerbation: Secondary | ICD-10-CM | POA: Diagnosis not present

## 2016-05-01 DIAGNOSIS — K219 Gastro-esophageal reflux disease without esophagitis: Secondary | ICD-10-CM | POA: Diagnosis not present

## 2016-05-01 DIAGNOSIS — M545 Low back pain: Secondary | ICD-10-CM | POA: Diagnosis not present

## 2016-05-01 DIAGNOSIS — I69391 Dysphagia following cerebral infarction: Secondary | ICD-10-CM | POA: Diagnosis not present

## 2016-05-01 DIAGNOSIS — R1312 Dysphagia, oropharyngeal phase: Secondary | ICD-10-CM | POA: Diagnosis not present

## 2016-05-01 DIAGNOSIS — M21372 Foot drop, left foot: Secondary | ICD-10-CM | POA: Diagnosis not present

## 2016-05-01 MED ORDER — SEREVENT DISKUS 50 MCG/DOSE IN AEPB: 1.0000 | INHALATION_SPRAY | Freq: Two times a day (BID) | RESPIRATORY_TRACT | 6 refills | Status: AC

## 2016-05-01 MED ORDER — FAMOTIDINE 20 MG PO TABS: 20.0000 mg | ORAL_TABLET | Freq: Every day | ORAL | 1 refills | Status: DC

## 2016-05-01 MED ORDER — INCRUSE ELLIPTA 62.5 MCG/INH IN AEPB: 1.0000 | INHALATION_SPRAY | Freq: Every day | RESPIRATORY_TRACT | 3 refills | Status: DC

## 2016-05-01 NOTE — Telephone Encounter (Signed)
cIosing encounter. Refills sent in another encounter

## 2016-05-02 ENCOUNTER — Ambulatory Visit: Payer: Medicare Other | Admitting: Family

## 2016-05-02 LAB — TOXASSURE SELECT 13 (MW), URINE

## 2016-05-05 ENCOUNTER — Other Ambulatory Visit: Payer: Self-pay | Admitting: Family

## 2016-05-07 ENCOUNTER — Ambulatory Visit: Payer: Medicare Other | Admitting: Family

## 2016-05-09 ENCOUNTER — Telehealth: Payer: Self-pay

## 2016-05-09 ENCOUNTER — Other Ambulatory Visit: Payer: Self-pay | Admitting: Family

## 2016-05-09 DIAGNOSIS — J441 Chronic obstructive pulmonary disease with (acute) exacerbation: Secondary | ICD-10-CM | POA: Diagnosis not present

## 2016-05-09 DIAGNOSIS — I69391 Dysphagia following cerebral infarction: Secondary | ICD-10-CM | POA: Diagnosis not present

## 2016-05-09 DIAGNOSIS — K219 Gastro-esophageal reflux disease without esophagitis: Secondary | ICD-10-CM | POA: Diagnosis not present

## 2016-05-09 DIAGNOSIS — M545 Low back pain: Secondary | ICD-10-CM | POA: Diagnosis not present

## 2016-05-09 DIAGNOSIS — R1312 Dysphagia, oropharyngeal phase: Secondary | ICD-10-CM | POA: Diagnosis not present

## 2016-05-09 DIAGNOSIS — M21372 Foot drop, left foot: Secondary | ICD-10-CM | POA: Diagnosis not present

## 2016-05-09 NOTE — Addendum Note (Signed)
Addended by: Evelina Dun A on: 05/09/2016 10:31 AM   Modules accepted: Orders

## 2016-05-09 NOTE — Telephone Encounter (Signed)
Kristy Moore was returning your call. Wasn;t sure what you were calling them about so advised them I would drop you a note. She said if possible call back tomorrow after 1:00

## 2016-05-10 DIAGNOSIS — K219 Gastro-esophageal reflux disease without esophagitis: Secondary | ICD-10-CM | POA: Diagnosis not present

## 2016-05-10 DIAGNOSIS — R1312 Dysphagia, oropharyngeal phase: Secondary | ICD-10-CM | POA: Diagnosis not present

## 2016-05-10 DIAGNOSIS — M21372 Foot drop, left foot: Secondary | ICD-10-CM | POA: Diagnosis not present

## 2016-05-10 DIAGNOSIS — M545 Low back pain: Secondary | ICD-10-CM | POA: Diagnosis not present

## 2016-05-10 DIAGNOSIS — I69391 Dysphagia following cerebral infarction: Secondary | ICD-10-CM | POA: Diagnosis not present

## 2016-05-10 DIAGNOSIS — J441 Chronic obstructive pulmonary disease with (acute) exacerbation: Secondary | ICD-10-CM | POA: Diagnosis not present

## 2016-05-14 ENCOUNTER — Other Ambulatory Visit: Payer: Self-pay | Admitting: Family

## 2016-05-15 ENCOUNTER — Ambulatory Visit: Payer: Medicare Other | Admitting: Family

## 2016-05-16 DIAGNOSIS — K219 Gastro-esophageal reflux disease without esophagitis: Secondary | ICD-10-CM | POA: Diagnosis not present

## 2016-05-16 DIAGNOSIS — I69391 Dysphagia following cerebral infarction: Secondary | ICD-10-CM | POA: Diagnosis not present

## 2016-05-16 DIAGNOSIS — J441 Chronic obstructive pulmonary disease with (acute) exacerbation: Secondary | ICD-10-CM | POA: Diagnosis not present

## 2016-05-16 DIAGNOSIS — R1312 Dysphagia, oropharyngeal phase: Secondary | ICD-10-CM | POA: Diagnosis not present

## 2016-05-16 DIAGNOSIS — M21372 Foot drop, left foot: Secondary | ICD-10-CM | POA: Diagnosis not present

## 2016-05-16 DIAGNOSIS — M545 Low back pain: Secondary | ICD-10-CM | POA: Diagnosis not present

## 2016-05-17 ENCOUNTER — Ambulatory Visit: Payer: Medicare Other | Admitting: Family

## 2016-05-17 DIAGNOSIS — K219 Gastro-esophageal reflux disease without esophagitis: Secondary | ICD-10-CM | POA: Diagnosis not present

## 2016-05-17 DIAGNOSIS — M21372 Foot drop, left foot: Secondary | ICD-10-CM | POA: Diagnosis not present

## 2016-05-17 DIAGNOSIS — I69391 Dysphagia following cerebral infarction: Secondary | ICD-10-CM | POA: Diagnosis not present

## 2016-05-17 DIAGNOSIS — R1312 Dysphagia, oropharyngeal phase: Secondary | ICD-10-CM | POA: Diagnosis not present

## 2016-05-17 DIAGNOSIS — J441 Chronic obstructive pulmonary disease with (acute) exacerbation: Secondary | ICD-10-CM | POA: Diagnosis not present

## 2016-05-17 DIAGNOSIS — M545 Low back pain: Secondary | ICD-10-CM | POA: Diagnosis not present

## 2016-05-18 ENCOUNTER — Ambulatory Visit (INDEPENDENT_AMBULATORY_CARE_PROVIDER_SITE_OTHER): Payer: Medicare Other | Admitting: Family Medicine

## 2016-05-18 DIAGNOSIS — R531 Weakness: Secondary | ICD-10-CM

## 2016-05-18 DIAGNOSIS — J9611 Chronic respiratory failure with hypoxia: Secondary | ICD-10-CM

## 2016-05-18 DIAGNOSIS — J9622 Acute and chronic respiratory failure with hypercapnia: Secondary | ICD-10-CM | POA: Diagnosis not present

## 2016-05-22 DIAGNOSIS — M21372 Foot drop, left foot: Secondary | ICD-10-CM | POA: Diagnosis not present

## 2016-05-22 DIAGNOSIS — J441 Chronic obstructive pulmonary disease with (acute) exacerbation: Secondary | ICD-10-CM | POA: Diagnosis not present

## 2016-05-22 DIAGNOSIS — I69391 Dysphagia following cerebral infarction: Secondary | ICD-10-CM | POA: Diagnosis not present

## 2016-05-22 DIAGNOSIS — K219 Gastro-esophageal reflux disease without esophagitis: Secondary | ICD-10-CM | POA: Diagnosis not present

## 2016-05-22 DIAGNOSIS — M545 Low back pain: Secondary | ICD-10-CM | POA: Diagnosis not present

## 2016-05-22 DIAGNOSIS — R1312 Dysphagia, oropharyngeal phase: Secondary | ICD-10-CM | POA: Diagnosis not present

## 2016-05-24 ENCOUNTER — Ambulatory Visit (INDEPENDENT_AMBULATORY_CARE_PROVIDER_SITE_OTHER): Payer: Medicare Other | Admitting: Family

## 2016-05-24 ENCOUNTER — Encounter: Payer: Self-pay | Admitting: Family

## 2016-05-24 VITALS — BP 137/87 | HR 95 | Temp 97.6°F | Ht 64.0 in | Wt 139.4 lb

## 2016-05-24 DIAGNOSIS — J441 Chronic obstructive pulmonary disease with (acute) exacerbation: Secondary | ICD-10-CM | POA: Diagnosis not present

## 2016-05-24 DIAGNOSIS — Z716 Tobacco abuse counseling: Secondary | ICD-10-CM

## 2016-05-24 DIAGNOSIS — F17200 Nicotine dependence, unspecified, uncomplicated: Secondary | ICD-10-CM

## 2016-05-24 DIAGNOSIS — Z720 Tobacco use: Secondary | ICD-10-CM | POA: Diagnosis not present

## 2016-05-24 DIAGNOSIS — J449 Chronic obstructive pulmonary disease, unspecified: Secondary | ICD-10-CM | POA: Diagnosis not present

## 2016-05-24 DIAGNOSIS — Z23 Encounter for immunization: Secondary | ICD-10-CM | POA: Diagnosis not present

## 2016-05-24 MED ORDER — BUPROPION HCL ER (SR) 150 MG PO TB12
150.0000 mg | ORAL_TABLET | Freq: Two times a day (BID) | ORAL | 1 refills | Status: DC
Start: 2016-05-24 — End: 2016-07-18

## 2016-05-24 NOTE — Patient Instructions (Signed)
Smoking Cessation, Tips for Success If you are ready to quit smoking, congratulations! You have chosen to help yourself be healthier. Cigarettes bring nicotine, tar, carbon monoxide, and other irritants into your body. Your lungs, heart, and blood vessels will be able to work better without these poisons. There are many different ways to quit smoking. Nicotine gum, nicotine patches, a nicotine inhaler, or nicotine nasal spray can help with physical craving. Hypnosis, support groups, and medicines help break the habit of smoking. WHAT THINGS CAN I DO TO MAKE QUITTING EASIER?  Here are some tips to help you quit for good:  Pick a date when you will quit smoking completely. Tell all of your friends and family about your plan to quit on that date.  Do not try to slowly cut down on the number of cigarettes you are smoking. Pick a quit date and quit smoking completely starting on that day.  Throw away all cigarettes.   Clean and remove all ashtrays from your home, work, and car.  On a card, write down your reasons for quitting. Carry the card with you and read it when you get the urge to smoke.  Cleanse your body of nicotine. Drink enough water and fluids to keep your urine clear or pale yellow. Do this after quitting to flush the nicotine from your body.  Learn to predict your moods. Do not let a bad situation be your excuse to have a cigarette. Some situations in your life might tempt you into wanting a cigarette.  Never have "just one" cigarette. It leads to wanting another and another. Remind yourself of your decision to quit.  Change habits associated with smoking. If you smoked while driving or when feeling stressed, try other activities to replace smoking. Stand up when drinking your coffee. Brush your teeth after eating. Sit in a different chair when you read the paper. Avoid alcohol while trying to quit, and try to drink fewer caffeinated beverages. Alcohol and caffeine may urge you to  smoke.  Avoid foods and drinks that can trigger a desire to smoke, such as sugary or spicy foods and alcohol.  Ask people who smoke not to smoke around you.  Have something planned to do right after eating or having a cup of coffee. For example, plan to take a walk or exercise.  Try a relaxation exercise to calm you down and decrease your stress. Remember, you may be tense and nervous for the first 2 weeks after you quit, but this will pass.  Find new activities to keep your hands busy. Play with a pen, coin, or rubber band. Doodle or draw things on paper.  Brush your teeth right after eating. This will help cut down on the craving for the taste of tobacco after meals. You can also try mouthwash.   Use oral substitutes in place of cigarettes. Try using lemon drops, carrots, cinnamon sticks, or chewing gum. Keep them handy so they are available when you have the urge to smoke.  When you have the urge to smoke, try deep breathing.  Designate your home as a nonsmoking area.  If you are a heavy smoker, ask your health care provider about a prescription for nicotine chewing gum. It can ease your withdrawal from nicotine.  Reward yourself. Set aside the cigarette money you save and buy yourself something nice.  Look for support from others. Join a support group or smoking cessation program. Ask someone at home or at work to help you with your plan   to quit smoking.  Always ask yourself, "Do I need this cigarette or is this just a reflex?" Tell yourself, "Today, I choose not to smoke," or "I do not want to smoke." You are reminding yourself of your decision to quit.  Do not replace cigarette smoking with electronic cigarettes (commonly called e-cigarettes). The safety of e-cigarettes is unknown, and some may contain harmful chemicals.  If you relapse, do not give up! Plan ahead and think about what you will do the next time you get the urge to smoke. HOW WILL I FEEL WHEN I QUIT SMOKING? You  may have symptoms of withdrawal because your body is used to nicotine (the addictive substance in cigarettes). You may crave cigarettes, be irritable, feel very hungry, cough often, get headaches, or have difficulty concentrating. The withdrawal symptoms are only temporary. They are strongest when you first quit but will go away within 10-14 days. When withdrawal symptoms occur, stay in control. Think about your reasons for quitting. Remind yourself that these are signs that your body is healing and getting used to being without cigarettes. Remember that withdrawal symptoms are easier to treat than the major diseases that smoking can cause.  Even after the withdrawal is over, expect periodic urges to smoke. However, these cravings are generally short lived and will go away whether you smoke or not. Do not smoke! WHAT RESOURCES ARE AVAILABLE TO HELP ME QUIT SMOKING? Your health care provider can direct you to community resources or hospitals for support, which may include:  Group support.  Education.  Hypnosis.  Therapy.   This information is not intended to replace advice given to you by your health care provider. Make sure you discuss any questions you have with your health care provider.   Document Released: 05/03/2004 Document Revised: 08/26/2014 Document Reviewed: 01/21/2013 Elsevier Interactive Patient Education 2016 Elsevier Inc.  

## 2016-05-24 NOTE — Progress Notes (Signed)
   Subjective:    Patient ID: Kristy Moore, female    DOB: 08/31/1960, 55 y.o.   MRN: [id number]  HPI PT presents to the office today to recheck COPD. PT was hospitalized in August with CAP and was intubated. PT states she is doing much better at this time. Pt continues to use serevent BID and incruse daily, and albuterol nebulizer nightly. PT had quit smoking for 4 months, but has started smoking a pack a day again.    Review of Systems  HENT: Negative.   Respiratory: Positive for cough, shortness of breath and wheezing. Negative for chest tightness.   Cardiovascular: Negative for chest pain and leg swelling.  Musculoskeletal: Positive for arthralgias, back pain and joint swelling.       Objective:   Physical Exam  Constitutional: She is oriented to person, place, and time. She appears well-developed and well-nourished. No distress.  HENT:  Head: Normocephalic and atraumatic.  Eyes: Pupils are equal, round, and reactive to light.  Neck: Normal range of motion. Neck supple. No thyromegaly present.  Cardiovascular: Normal rate, regular rhythm, normal heart sounds and intact distal pulses.   No murmur heard. Pulmonary/Chest: Effort normal. No respiratory distress. She has wheezes.  Wheezing bilaterally   Abdominal: Soft. Bowel sounds are normal. She exhibits no distension. There is no tenderness.  Musculoskeletal: Normal range of motion. She exhibits no edema or tenderness.  Neurological: She is alert and oriented to person, place, and time. She has normal reflexes. No cranial nerve deficit.  Skin: Skin is warm and dry.  Psychiatric: She has a normal mood and affect. Her behavior is normal. Judgment and thought content normal.  Vitals reviewed.   BP 137/87   Pulse 95   Temp 97.6 F (36.4 C) (Oral)   Ht 5\' 4"  (1.626 m)   Wt 139 lb 6.4 oz (63.2 kg)   LMP 09/21/2003   SpO2 94%   BMI 23.93 kg/m        Assessment & Plan:  1. Chronic obstructive pulmonary disease,  unspecified COPD type (HCC) - buPROPion (WELLBUTRIN SR) 150 MG 12 hr tablet; Take 1 tablet (150 mg total) by mouth 2 (two) times daily.  Dispense: 60 tablet; Refill: 1  2. COPD exacerbation (HCC) - buPROPion (WELLBUTRIN SR) 150 MG 12 hr tablet; Take 1 tablet (150 mg total) by mouth 2 (two) times daily.  Dispense: 60 tablet; Refill: 1  3. Current smoker - buPROPion (WELLBUTRIN SR) 150 MG 12 hr tablet; Take 1 tablet (150 mg total) by mouth 2 (two) times daily.  Dispense: 60 tablet; Refill: 1  4. Encounter for smoking cessation counseling - buPROPion (WELLBUTRIN SR) 150 MG 12 hr tablet; Take 1 tablet (150 mg total) by mouth 2 (two) times daily.  Dispense: 60 tablet; Refill: 1  Smoking cessation discussed Continue inhalers Keep all chronic follow ups, RTO in 3 months  Evelina Dun, FNP

## 2016-05-27 ENCOUNTER — Encounter: Payer: Self-pay | Admitting: Family

## 2016-05-27 DIAGNOSIS — J441 Chronic obstructive pulmonary disease with (acute) exacerbation: Secondary | ICD-10-CM | POA: Diagnosis not present

## 2016-05-27 DIAGNOSIS — K219 Gastro-esophageal reflux disease without esophagitis: Secondary | ICD-10-CM | POA: Diagnosis not present

## 2016-05-27 DIAGNOSIS — R1312 Dysphagia, oropharyngeal phase: Secondary | ICD-10-CM | POA: Diagnosis not present

## 2016-05-27 DIAGNOSIS — I69391 Dysphagia following cerebral infarction: Secondary | ICD-10-CM | POA: Diagnosis not present

## 2016-05-27 DIAGNOSIS — M21372 Foot drop, left foot: Secondary | ICD-10-CM | POA: Diagnosis not present

## 2016-05-27 DIAGNOSIS — M545 Low back pain: Secondary | ICD-10-CM | POA: Diagnosis not present

## 2016-05-29 DIAGNOSIS — J441 Chronic obstructive pulmonary disease with (acute) exacerbation: Secondary | ICD-10-CM | POA: Diagnosis not present

## 2016-05-29 DIAGNOSIS — I69391 Dysphagia following cerebral infarction: Secondary | ICD-10-CM | POA: Diagnosis not present

## 2016-05-29 DIAGNOSIS — M21372 Foot drop, left foot: Secondary | ICD-10-CM | POA: Diagnosis not present

## 2016-05-29 DIAGNOSIS — K219 Gastro-esophageal reflux disease without esophagitis: Secondary | ICD-10-CM | POA: Diagnosis not present

## 2016-05-29 DIAGNOSIS — M545 Low back pain: Secondary | ICD-10-CM | POA: Diagnosis not present

## 2016-05-29 DIAGNOSIS — R1312 Dysphagia, oropharyngeal phase: Secondary | ICD-10-CM | POA: Diagnosis not present

## 2016-06-01 ENCOUNTER — Other Ambulatory Visit: Payer: Self-pay | Admitting: Family

## 2016-06-01 DIAGNOSIS — J449 Chronic obstructive pulmonary disease, unspecified: Secondary | ICD-10-CM

## 2016-06-01 DIAGNOSIS — J189 Pneumonia, unspecified organism: Secondary | ICD-10-CM

## 2016-06-12 DIAGNOSIS — Z79891 Long term (current) use of opiate analgesic: Secondary | ICD-10-CM | POA: Diagnosis not present

## 2016-07-03 DIAGNOSIS — G5601 Carpal tunnel syndrome, right upper limb: Secondary | ICD-10-CM | POA: Diagnosis not present

## 2016-07-03 DIAGNOSIS — M67431 Ganglion, right wrist: Secondary | ICD-10-CM | POA: Diagnosis not present

## 2016-07-05 DIAGNOSIS — Z79891 Long term (current) use of opiate analgesic: Secondary | ICD-10-CM | POA: Diagnosis not present

## 2016-07-07 ENCOUNTER — Other Ambulatory Visit: Payer: Self-pay | Admitting: Family

## 2016-07-07 DIAGNOSIS — G8929 Other chronic pain: Secondary | ICD-10-CM

## 2016-07-07 DIAGNOSIS — M549 Dorsalgia, unspecified: Principal | ICD-10-CM

## 2016-07-08 ENCOUNTER — Other Ambulatory Visit: Payer: Self-pay | Admitting: Family

## 2016-07-18 ENCOUNTER — Other Ambulatory Visit: Payer: Self-pay | Admitting: Family

## 2016-07-18 DIAGNOSIS — F17200 Nicotine dependence, unspecified, uncomplicated: Secondary | ICD-10-CM

## 2016-07-18 DIAGNOSIS — Z716 Tobacco abuse counseling: Secondary | ICD-10-CM

## 2016-07-18 DIAGNOSIS — J449 Chronic obstructive pulmonary disease, unspecified: Secondary | ICD-10-CM

## 2016-07-18 DIAGNOSIS — J441 Chronic obstructive pulmonary disease with (acute) exacerbation: Secondary | ICD-10-CM

## 2016-07-21 IMAGING — CR DG CHEST 2V
2 series · 2 of 2 positions shown · non-contrast
Comparison: 01/16/2014.

CLINICAL DATA: Cough.

EXAM:
CHEST  2 VIEW

[view not recorded (1 of 2)]
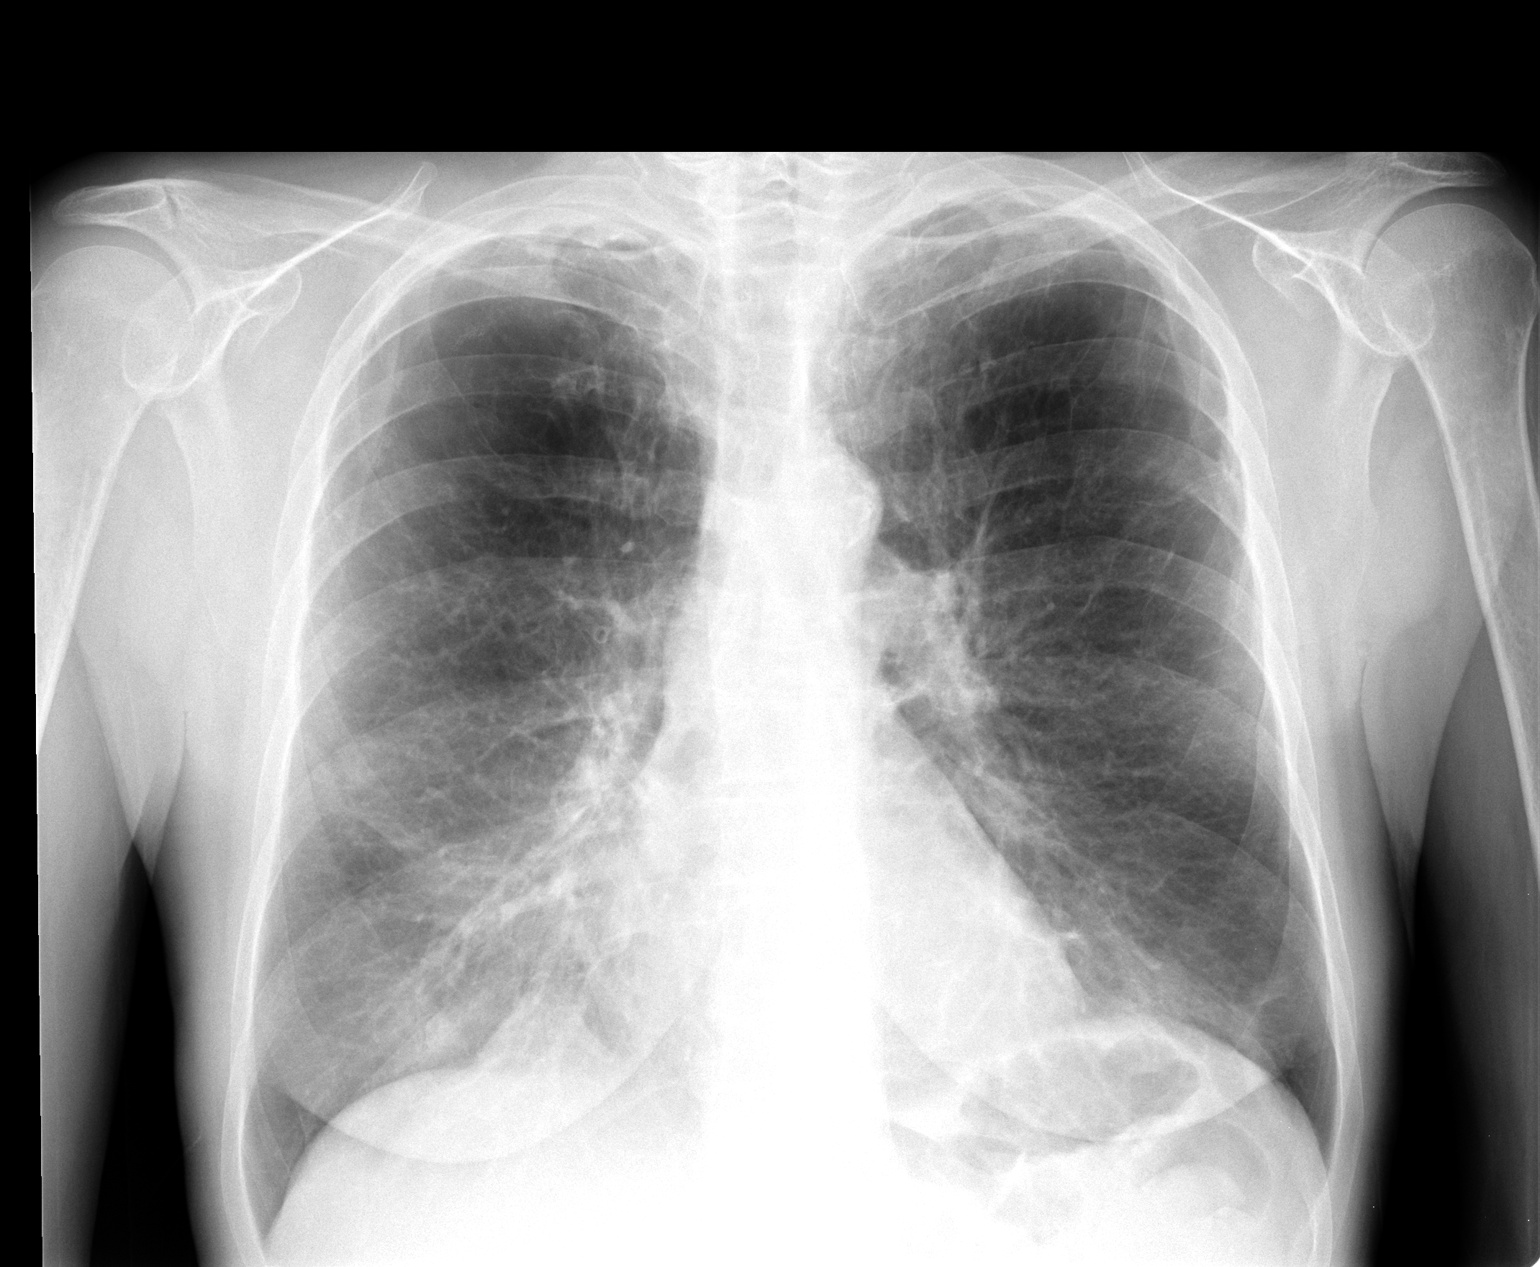

[view not recorded (2 of 2)]
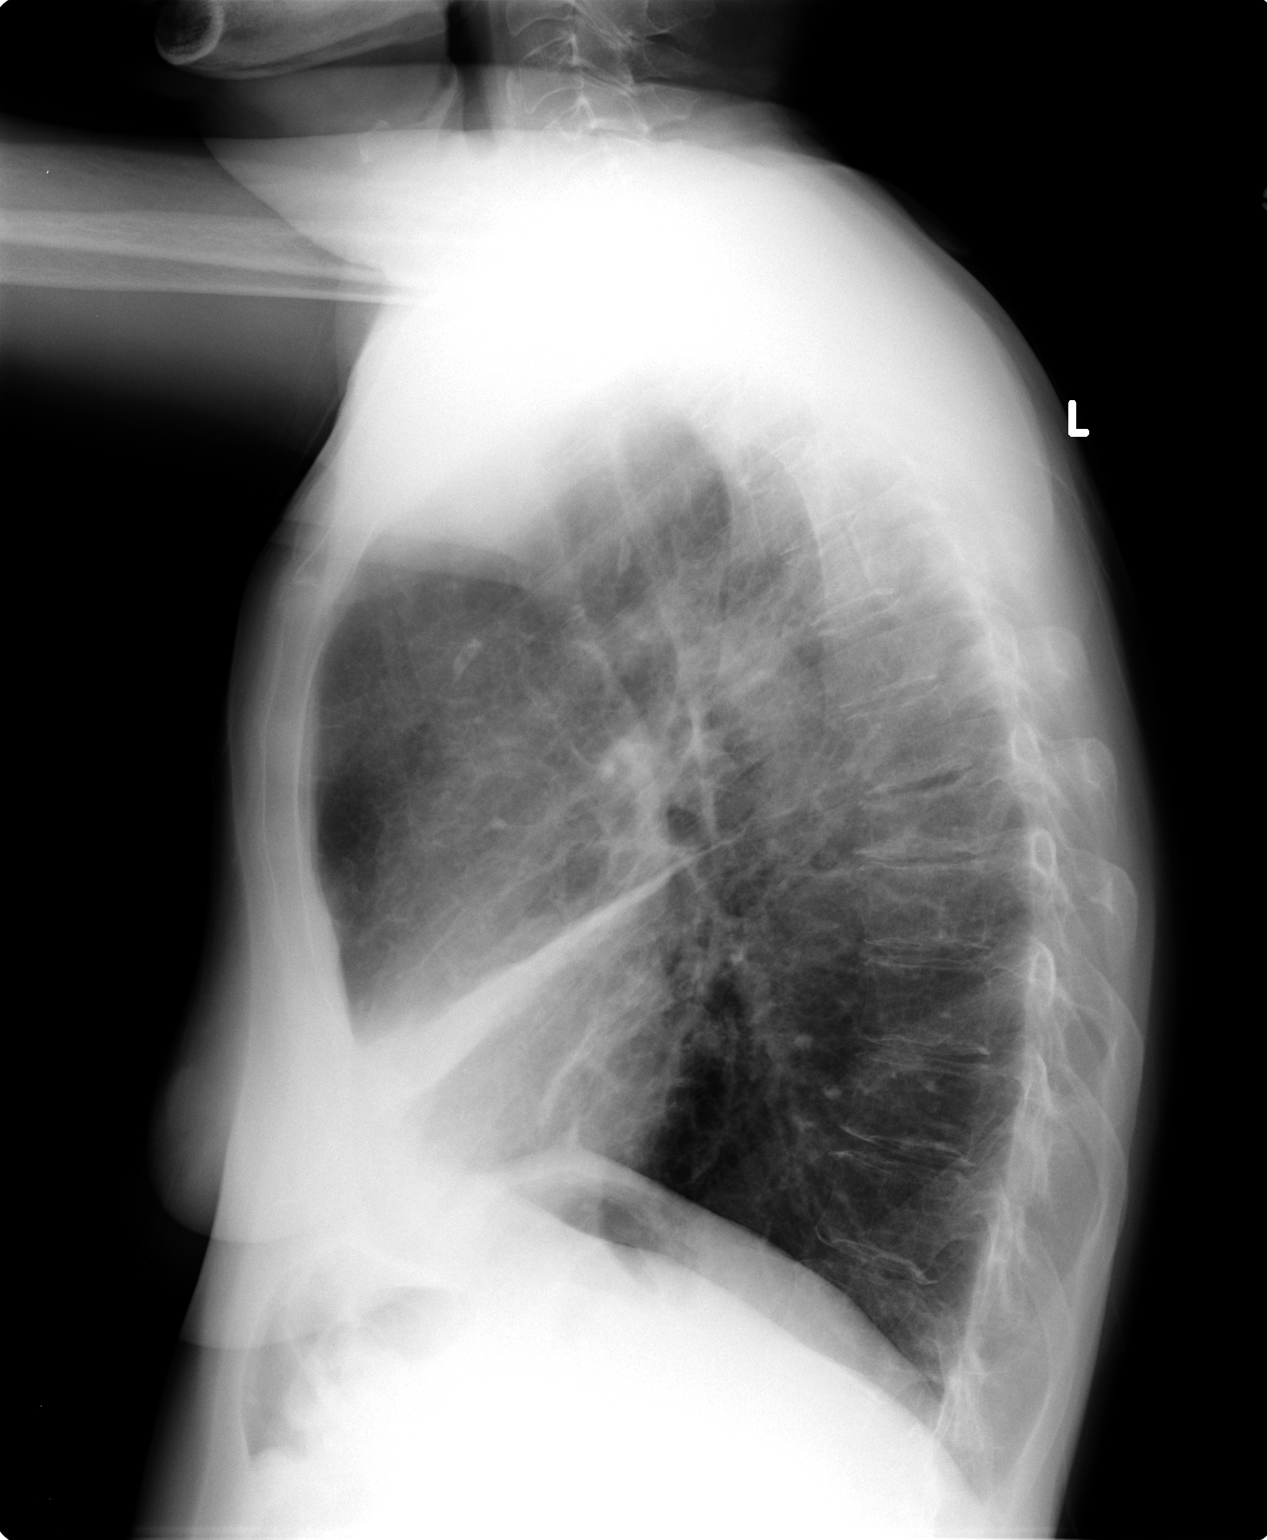

[2 of 2 positions shown; findings below may reference images not displayed]

FINDINGS: Mediastinum hilar structures normal. Heart size normal. Bilateral
pulmonary interstitial prominence present. These findings suggest
pneumonitis. Right middle lobe atelectatic changes and/or fissural
pleural fluid noted. No pneumothorax. No acute bony abnormality.
IMPRESSION: 1. Bilateral pulmonary interstitial prominence suggesting
pneumonitis .
2. Right middle lobe subsegmental atelectasis and/or fissural
pleural fluid noted.

## 2016-08-08 ENCOUNTER — Other Ambulatory Visit: Payer: Self-pay | Admitting: Family

## 2016-08-14 ENCOUNTER — Telehealth: Payer: Self-pay | Admitting: Family

## 2016-08-14 IMAGING — CT CT HEAD W/O CM
1 of 2 series · 13 of 30 positions shown, 17 images · non-contrast
Comparison: None.

CLINICAL DATA: Confusion

EXAM:
CT HEAD WITHOUT CONTRAST
TECHNIQUE: Contiguous axial images were obtained from the base of the skull
through the vertex without intravenous contrast.

[Series 2: headseq 4.8 h37s · axial · 0.48mm/px · z∈[+102,+255]mm · 13 of 36 slices shown, 17 images]
[im 3/36  brain]
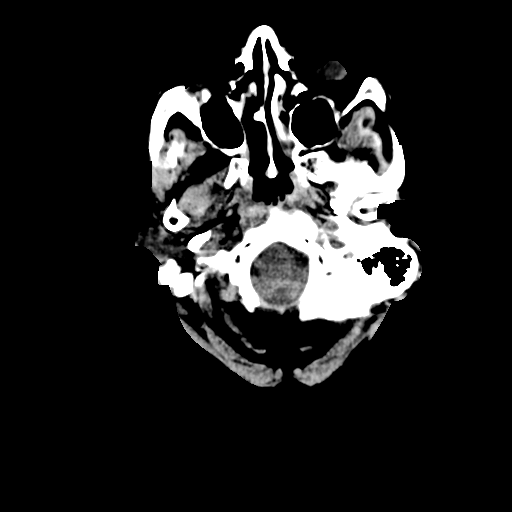
[im 3/36  bone]
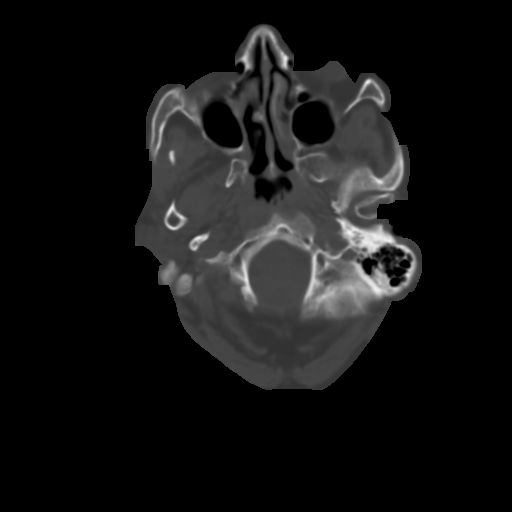
[im 6/36  brain]
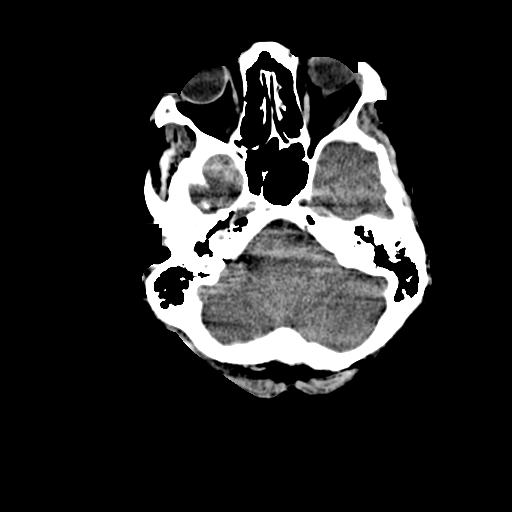
[im 8/36  brain]
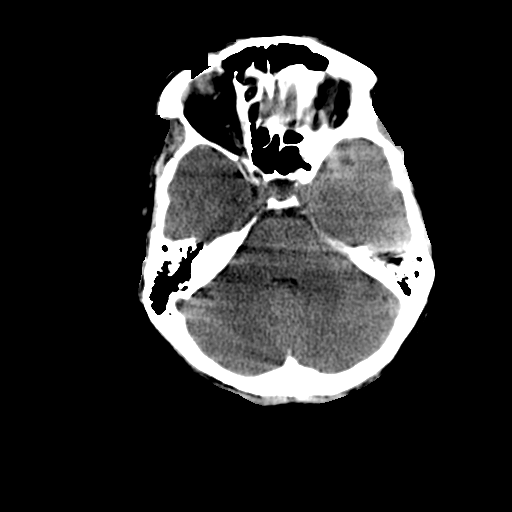
[im 11/36  brain]
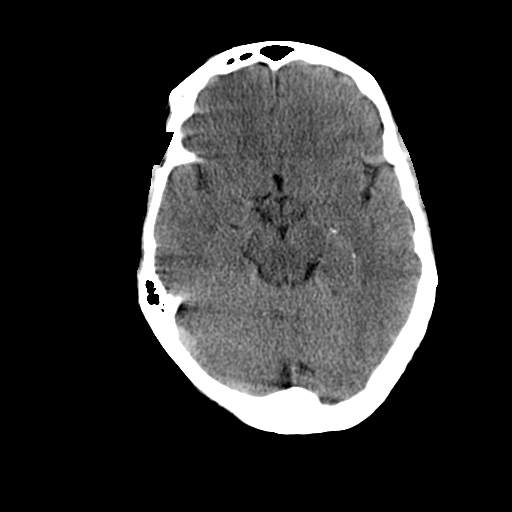
[im 13/36  brain]
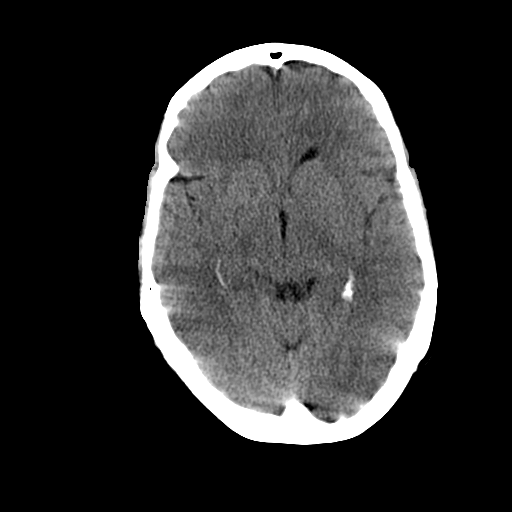
[im 13/36  bone]
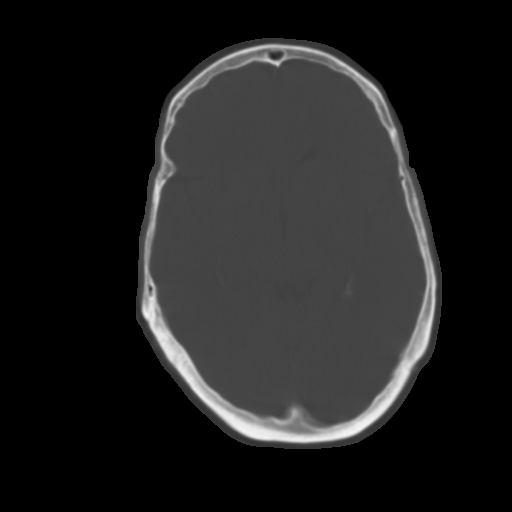
[im 16/36  brain]
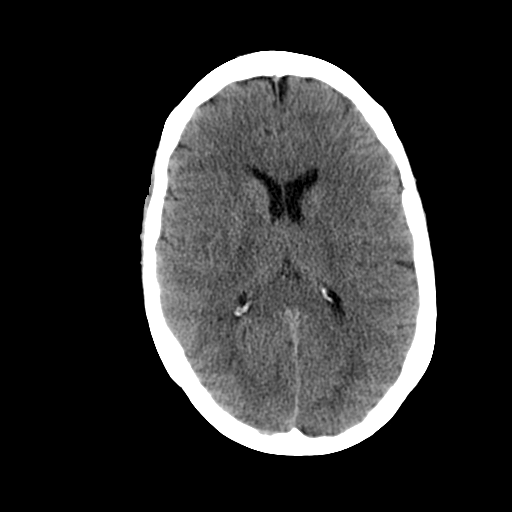
[im 18/36  brain]
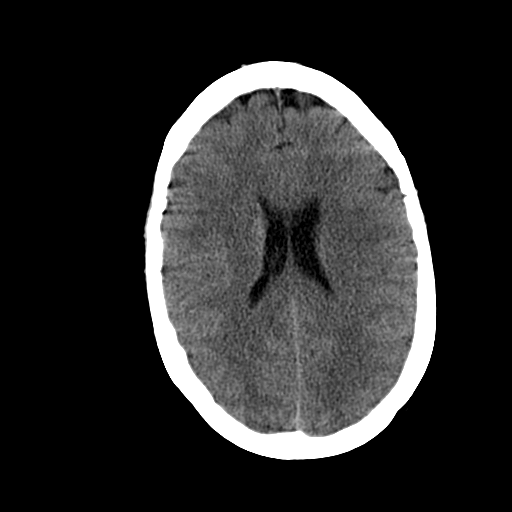
[im 21/36  brain]
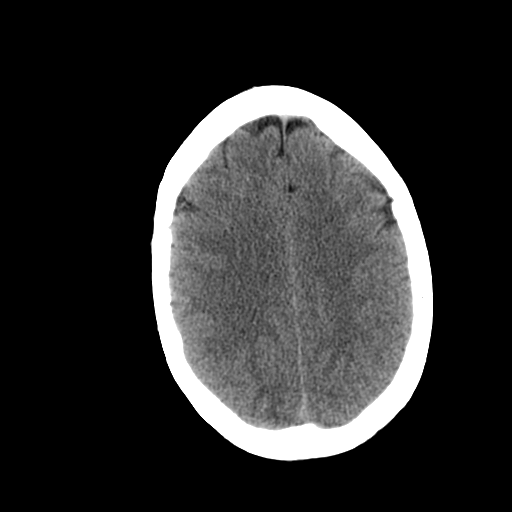
[im 23/36  brain]
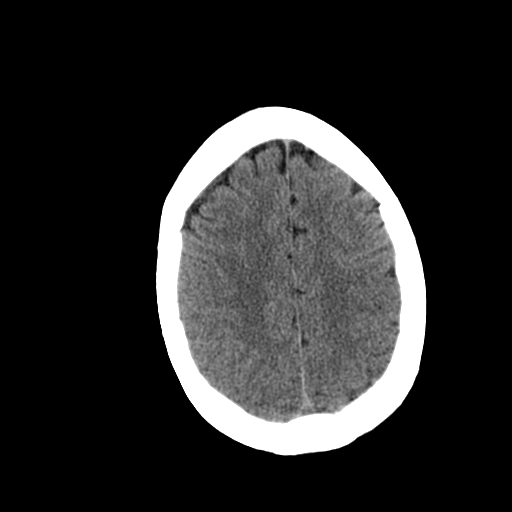
[im 23/36  bone]
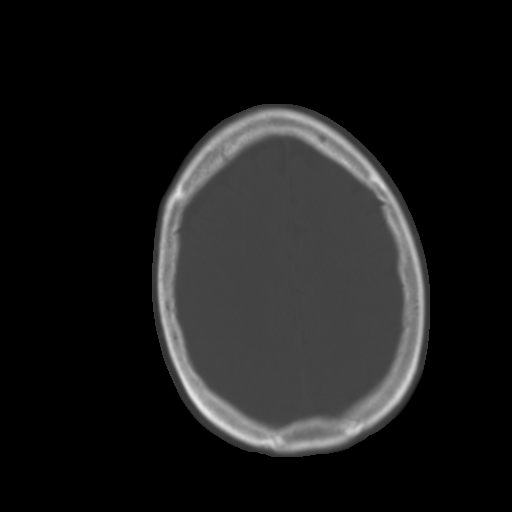
[im 26/36  brain]
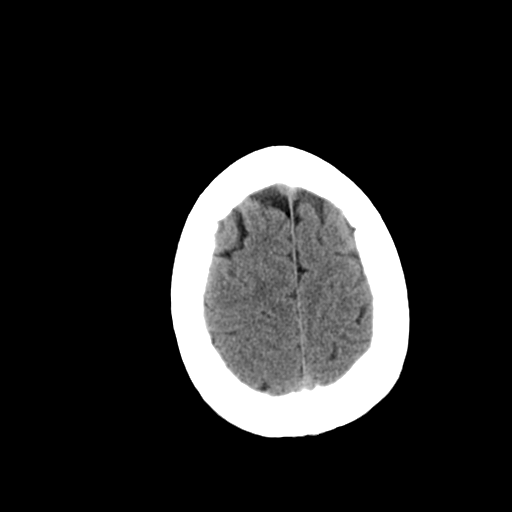
[im 28/36  brain]
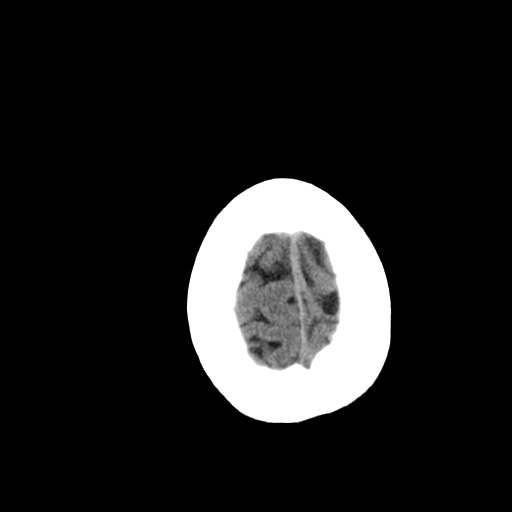
[im 31/36  brain]
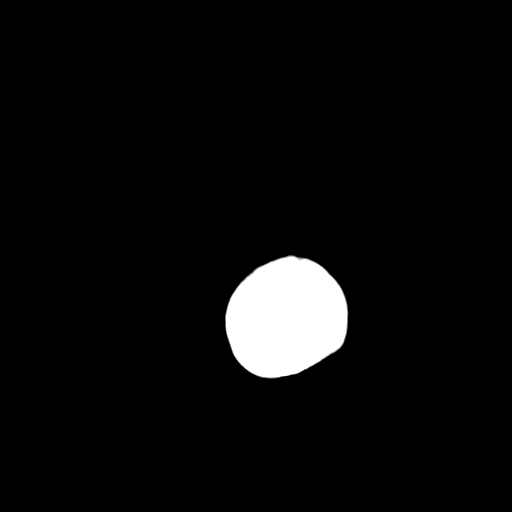
[im 33/36  brain]
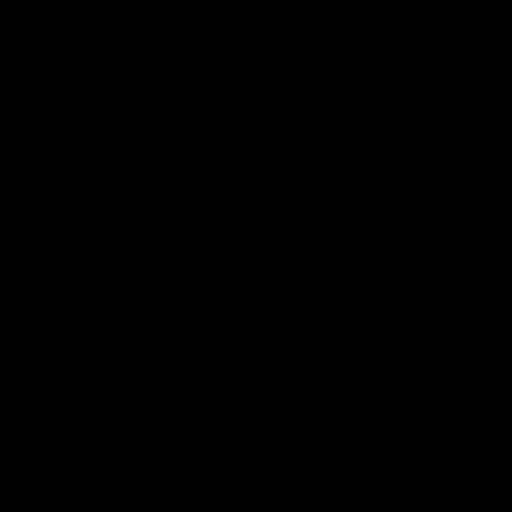
[im 33/36  bone]
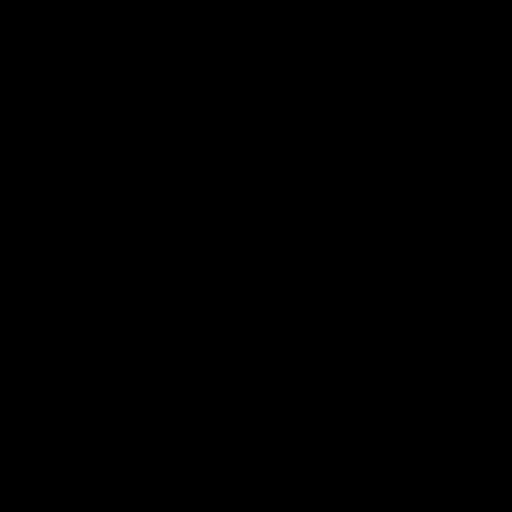

[13 of 30 positions shown; findings below may reference images not displayed]

FINDINGS: There is a degree of motion artifact making this study less than
optimal. The ventricles are normal in size and configuration. There
is no apparent mass, hemorrhage, extra-axial fluid collection, or
midline shift. Gray-white compartments appear normal. No acute
infarct apparent. Bony calvarium appears intact. The mastoid air
cells are clear.
IMPRESSION: Study within normal limits. No appreciable intracranial mass,
hemorrhage, or focal gray -white compartment lesion.

## 2016-08-14 NOTE — Telephone Encounter (Signed)
Note faxed.

## 2016-08-16 DIAGNOSIS — M79604 Pain in right leg: Secondary | ICD-10-CM | POA: Diagnosis not present

## 2016-08-16 DIAGNOSIS — M79642 Pain in left hand: Secondary | ICD-10-CM | POA: Diagnosis not present

## 2016-08-16 DIAGNOSIS — Z79891 Long term (current) use of opiate analgesic: Secondary | ICD-10-CM | POA: Diagnosis not present

## 2016-08-16 DIAGNOSIS — M25562 Pain in left knee: Secondary | ICD-10-CM | POA: Diagnosis not present

## 2016-08-16 DIAGNOSIS — M79662 Pain in left lower leg: Secondary | ICD-10-CM | POA: Diagnosis not present

## 2016-08-16 DIAGNOSIS — G894 Chronic pain syndrome: Secondary | ICD-10-CM | POA: Diagnosis not present

## 2016-08-16 DIAGNOSIS — M79661 Pain in right lower leg: Secondary | ICD-10-CM | POA: Diagnosis not present

## 2016-08-16 DIAGNOSIS — M25561 Pain in right knee: Secondary | ICD-10-CM | POA: Diagnosis not present

## 2016-08-16 DIAGNOSIS — M79605 Pain in left leg: Secondary | ICD-10-CM | POA: Diagnosis not present

## 2016-08-22 DIAGNOSIS — J449 Chronic obstructive pulmonary disease, unspecified: Secondary | ICD-10-CM | POA: Diagnosis not present

## 2016-08-22 DIAGNOSIS — Z9981 Dependence on supplemental oxygen: Secondary | ICD-10-CM | POA: Diagnosis not present

## 2016-08-22 DIAGNOSIS — F17210 Nicotine dependence, cigarettes, uncomplicated: Secondary | ICD-10-CM | POA: Diagnosis not present

## 2016-08-26 ENCOUNTER — Ambulatory Visit: Payer: Medicare Other | Admitting: Family

## 2016-08-27 ENCOUNTER — Telehealth: Payer: Self-pay

## 2016-08-27 NOTE — Telephone Encounter (Signed)
Ok

## 2016-08-30 ENCOUNTER — Ambulatory Visit: Payer: Medicare Other | Admitting: Family

## 2016-09-02 ENCOUNTER — Encounter: Payer: Self-pay | Admitting: Family

## 2016-09-06 DIAGNOSIS — Z9981 Dependence on supplemental oxygen: Secondary | ICD-10-CM | POA: Diagnosis not present

## 2016-09-06 DIAGNOSIS — J449 Chronic obstructive pulmonary disease, unspecified: Secondary | ICD-10-CM | POA: Diagnosis not present

## 2016-09-06 DIAGNOSIS — F17210 Nicotine dependence, cigarettes, uncomplicated: Secondary | ICD-10-CM | POA: Diagnosis not present

## 2016-09-11 ENCOUNTER — Other Ambulatory Visit: Payer: Self-pay | Admitting: Family

## 2016-09-12 DIAGNOSIS — J449 Chronic obstructive pulmonary disease, unspecified: Secondary | ICD-10-CM | POA: Diagnosis not present

## 2016-09-12 DIAGNOSIS — F17210 Nicotine dependence, cigarettes, uncomplicated: Secondary | ICD-10-CM | POA: Diagnosis not present

## 2016-09-12 DIAGNOSIS — Z9981 Dependence on supplemental oxygen: Secondary | ICD-10-CM | POA: Diagnosis not present

## 2016-09-13 DIAGNOSIS — M79662 Pain in left lower leg: Secondary | ICD-10-CM | POA: Diagnosis not present

## 2016-09-13 DIAGNOSIS — M79661 Pain in right lower leg: Secondary | ICD-10-CM | POA: Diagnosis not present

## 2016-09-13 DIAGNOSIS — G894 Chronic pain syndrome: Secondary | ICD-10-CM | POA: Diagnosis not present

## 2016-09-13 DIAGNOSIS — M25562 Pain in left knee: Secondary | ICD-10-CM | POA: Diagnosis not present

## 2016-09-13 DIAGNOSIS — M25561 Pain in right knee: Secondary | ICD-10-CM | POA: Diagnosis not present

## 2016-09-13 DIAGNOSIS — M545 Low back pain: Secondary | ICD-10-CM | POA: Diagnosis not present

## 2016-09-13 DIAGNOSIS — Z79891 Long term (current) use of opiate analgesic: Secondary | ICD-10-CM | POA: Diagnosis not present

## 2016-09-13 DIAGNOSIS — M79605 Pain in left leg: Secondary | ICD-10-CM | POA: Diagnosis not present

## 2016-09-13 DIAGNOSIS — M79604 Pain in right leg: Secondary | ICD-10-CM | POA: Diagnosis not present

## 2016-09-19 ENCOUNTER — Ambulatory Visit: Payer: Medicare Other | Admitting: Family

## 2016-09-20 DIAGNOSIS — Z9981 Dependence on supplemental oxygen: Secondary | ICD-10-CM | POA: Diagnosis not present

## 2016-09-20 DIAGNOSIS — F17210 Nicotine dependence, cigarettes, uncomplicated: Secondary | ICD-10-CM | POA: Diagnosis not present

## 2016-09-20 DIAGNOSIS — J449 Chronic obstructive pulmonary disease, unspecified: Secondary | ICD-10-CM | POA: Diagnosis not present

## 2016-09-23 ENCOUNTER — Encounter: Payer: Self-pay | Admitting: Family

## 2016-09-23 DIAGNOSIS — E8801 Alpha-1-antitrypsin deficiency: Secondary | ICD-10-CM | POA: Diagnosis not present

## 2016-09-23 DIAGNOSIS — J9611 Chronic respiratory failure with hypoxia: Secondary | ICD-10-CM | POA: Diagnosis not present

## 2016-09-23 DIAGNOSIS — J9612 Chronic respiratory failure with hypercapnia: Secondary | ICD-10-CM | POA: Diagnosis not present

## 2016-09-23 DIAGNOSIS — J431 Panlobular emphysema: Secondary | ICD-10-CM | POA: Diagnosis not present

## 2016-09-23 DIAGNOSIS — K219 Gastro-esophageal reflux disease without esophagitis: Secondary | ICD-10-CM | POA: Diagnosis not present

## 2016-09-23 DIAGNOSIS — Z720 Tobacco use: Secondary | ICD-10-CM | POA: Diagnosis not present

## 2016-09-24 ENCOUNTER — Ambulatory Visit: Payer: Medicare Other | Admitting: Family

## 2016-10-03 DIAGNOSIS — J449 Chronic obstructive pulmonary disease, unspecified: Secondary | ICD-10-CM | POA: Diagnosis not present

## 2016-10-03 DIAGNOSIS — Z9981 Dependence on supplemental oxygen: Secondary | ICD-10-CM | POA: Diagnosis not present

## 2016-10-03 DIAGNOSIS — F17210 Nicotine dependence, cigarettes, uncomplicated: Secondary | ICD-10-CM | POA: Diagnosis not present

## 2016-10-04 DIAGNOSIS — F17210 Nicotine dependence, cigarettes, uncomplicated: Secondary | ICD-10-CM | POA: Diagnosis not present

## 2016-10-04 DIAGNOSIS — Z9981 Dependence on supplemental oxygen: Secondary | ICD-10-CM | POA: Diagnosis not present

## 2016-10-04 DIAGNOSIS — J449 Chronic obstructive pulmonary disease, unspecified: Secondary | ICD-10-CM | POA: Diagnosis not present

## 2016-10-11 ENCOUNTER — Ambulatory Visit: Payer: Medicare Other | Admitting: Family

## 2016-10-18 DIAGNOSIS — F17210 Nicotine dependence, cigarettes, uncomplicated: Secondary | ICD-10-CM | POA: Diagnosis not present

## 2016-10-18 DIAGNOSIS — Z9981 Dependence on supplemental oxygen: Secondary | ICD-10-CM | POA: Diagnosis not present

## 2016-10-18 DIAGNOSIS — J449 Chronic obstructive pulmonary disease, unspecified: Secondary | ICD-10-CM | POA: Diagnosis not present

## 2016-10-21 DIAGNOSIS — F17210 Nicotine dependence, cigarettes, uncomplicated: Secondary | ICD-10-CM | POA: Diagnosis not present

## 2016-10-21 DIAGNOSIS — Z9981 Dependence on supplemental oxygen: Secondary | ICD-10-CM | POA: Diagnosis not present

## 2016-10-21 DIAGNOSIS — J449 Chronic obstructive pulmonary disease, unspecified: Secondary | ICD-10-CM | POA: Diagnosis not present

## 2016-10-25 ENCOUNTER — Other Ambulatory Visit: Payer: Self-pay | Admitting: Family

## 2016-11-01 ENCOUNTER — Other Ambulatory Visit: Payer: Self-pay | Admitting: Family

## 2016-11-01 DIAGNOSIS — J449 Chronic obstructive pulmonary disease, unspecified: Secondary | ICD-10-CM | POA: Diagnosis not present

## 2016-11-01 DIAGNOSIS — F17210 Nicotine dependence, cigarettes, uncomplicated: Secondary | ICD-10-CM | POA: Diagnosis not present

## 2016-11-01 DIAGNOSIS — Z9981 Dependence on supplemental oxygen: Secondary | ICD-10-CM | POA: Diagnosis not present

## 2016-11-04 DIAGNOSIS — M79661 Pain in right lower leg: Secondary | ICD-10-CM | POA: Diagnosis not present

## 2016-11-04 DIAGNOSIS — Z79891 Long term (current) use of opiate analgesic: Secondary | ICD-10-CM | POA: Diagnosis not present

## 2016-11-04 DIAGNOSIS — M79662 Pain in left lower leg: Secondary | ICD-10-CM | POA: Diagnosis not present

## 2016-11-04 DIAGNOSIS — M79604 Pain in right leg: Secondary | ICD-10-CM | POA: Diagnosis not present

## 2016-11-04 DIAGNOSIS — M79605 Pain in left leg: Secondary | ICD-10-CM | POA: Diagnosis not present

## 2016-11-04 DIAGNOSIS — G894 Chronic pain syndrome: Secondary | ICD-10-CM | POA: Diagnosis not present

## 2016-11-04 DIAGNOSIS — G890 Central pain syndrome: Secondary | ICD-10-CM | POA: Diagnosis not present

## 2016-11-07 ENCOUNTER — Ambulatory Visit: Payer: Medicare Other | Admitting: Family

## 2016-11-07 DIAGNOSIS — J168 Pneumonia due to other specified infectious organisms: Secondary | ICD-10-CM | POA: Diagnosis not present

## 2016-11-07 DIAGNOSIS — R0602 Shortness of breath: Secondary | ICD-10-CM | POA: Diagnosis not present

## 2016-11-07 DIAGNOSIS — R1310 Dysphagia, unspecified: Secondary | ICD-10-CM | POA: Diagnosis not present

## 2016-11-07 DIAGNOSIS — F17210 Nicotine dependence, cigarettes, uncomplicated: Secondary | ICD-10-CM | POA: Diagnosis not present

## 2016-11-07 DIAGNOSIS — J156 Pneumonia due to other aerobic Gram-negative bacteria: Secondary | ICD-10-CM | POA: Diagnosis not present

## 2016-11-07 DIAGNOSIS — J441 Chronic obstructive pulmonary disease with (acute) exacerbation: Secondary | ICD-10-CM | POA: Diagnosis not present

## 2016-11-07 DIAGNOSIS — J449 Chronic obstructive pulmonary disease, unspecified: Secondary | ICD-10-CM | POA: Diagnosis not present

## 2016-11-07 DIAGNOSIS — Z9981 Dependence on supplemental oxygen: Secondary | ICD-10-CM | POA: Diagnosis not present

## 2016-11-07 DIAGNOSIS — J440 Chronic obstructive pulmonary disease with acute lower respiratory infection: Secondary | ICD-10-CM | POA: Diagnosis not present

## 2016-11-07 DIAGNOSIS — J9611 Chronic respiratory failure with hypoxia: Secondary | ICD-10-CM | POA: Diagnosis not present

## 2016-11-08 DIAGNOSIS — Z720 Tobacco use: Secondary | ICD-10-CM | POA: Diagnosis not present

## 2016-11-08 DIAGNOSIS — R1310 Dysphagia, unspecified: Secondary | ICD-10-CM | POA: Diagnosis present

## 2016-11-08 DIAGNOSIS — Z8614 Personal history of Methicillin resistant Staphylococcus aureus infection: Secondary | ICD-10-CM | POA: Diagnosis not present

## 2016-11-08 DIAGNOSIS — R05 Cough: Secondary | ICD-10-CM | POA: Diagnosis not present

## 2016-11-08 DIAGNOSIS — R0600 Dyspnea, unspecified: Secondary | ICD-10-CM | POA: Diagnosis not present

## 2016-11-08 DIAGNOSIS — J9611 Chronic respiratory failure with hypoxia: Secondary | ICD-10-CM | POA: Diagnosis present

## 2016-11-08 DIAGNOSIS — F17210 Nicotine dependence, cigarettes, uncomplicated: Secondary | ICD-10-CM | POA: Diagnosis present

## 2016-11-08 DIAGNOSIS — Z8673 Personal history of transient ischemic attack (TIA), and cerebral infarction without residual deficits: Secondary | ICD-10-CM | POA: Diagnosis not present

## 2016-11-08 DIAGNOSIS — J849 Interstitial pulmonary disease, unspecified: Secondary | ICD-10-CM | POA: Diagnosis not present

## 2016-11-08 DIAGNOSIS — K219 Gastro-esophageal reflux disease without esophagitis: Secondary | ICD-10-CM | POA: Diagnosis present

## 2016-11-08 DIAGNOSIS — J441 Chronic obstructive pulmonary disease with (acute) exacerbation: Secondary | ICD-10-CM | POA: Diagnosis present

## 2016-11-08 DIAGNOSIS — J440 Chronic obstructive pulmonary disease with acute lower respiratory infection: Secondary | ICD-10-CM | POA: Diagnosis present

## 2016-11-08 DIAGNOSIS — J156 Pneumonia due to other aerobic Gram-negative bacteria: Secondary | ICD-10-CM | POA: Diagnosis present

## 2016-11-08 DIAGNOSIS — Z8701 Personal history of pneumonia (recurrent): Secondary | ICD-10-CM | POA: Diagnosis not present

## 2016-11-08 DIAGNOSIS — J181 Lobar pneumonia, unspecified organism: Secondary | ICD-10-CM | POA: Diagnosis not present

## 2016-11-08 DIAGNOSIS — Z9981 Dependence on supplemental oxygen: Secondary | ICD-10-CM | POA: Diagnosis not present

## 2016-11-12 ENCOUNTER — Ambulatory Visit: Payer: Medicare Other | Admitting: Family

## 2016-11-12 DIAGNOSIS — F17210 Nicotine dependence, cigarettes, uncomplicated: Secondary | ICD-10-CM | POA: Diagnosis not present

## 2016-11-12 DIAGNOSIS — Z9981 Dependence on supplemental oxygen: Secondary | ICD-10-CM | POA: Diagnosis not present

## 2016-11-12 DIAGNOSIS — J449 Chronic obstructive pulmonary disease, unspecified: Secondary | ICD-10-CM | POA: Diagnosis not present

## 2016-11-14 ENCOUNTER — Ambulatory Visit: Payer: Medicare Other | Admitting: Family

## 2016-11-18 ENCOUNTER — Encounter: Payer: Self-pay | Admitting: Family

## 2016-11-19 ENCOUNTER — Ambulatory Visit: Payer: Medicare Other | Admitting: Family

## 2016-11-22 DIAGNOSIS — F17210 Nicotine dependence, cigarettes, uncomplicated: Secondary | ICD-10-CM | POA: Diagnosis not present

## 2016-11-22 DIAGNOSIS — J449 Chronic obstructive pulmonary disease, unspecified: Secondary | ICD-10-CM | POA: Diagnosis not present

## 2016-11-22 DIAGNOSIS — Z9981 Dependence on supplemental oxygen: Secondary | ICD-10-CM | POA: Diagnosis not present

## 2016-11-23 DIAGNOSIS — Z9981 Dependence on supplemental oxygen: Secondary | ICD-10-CM | POA: Diagnosis not present

## 2016-11-23 DIAGNOSIS — J80 Acute respiratory distress syndrome: Secondary | ICD-10-CM | POA: Diagnosis not present

## 2016-11-23 DIAGNOSIS — K5909 Other constipation: Secondary | ICD-10-CM | POA: Diagnosis present

## 2016-11-23 DIAGNOSIS — Z720 Tobacco use: Secondary | ICD-10-CM | POA: Diagnosis not present

## 2016-11-23 DIAGNOSIS — Z8673 Personal history of transient ischemic attack (TIA), and cerebral infarction without residual deficits: Secondary | ICD-10-CM | POA: Diagnosis not present

## 2016-11-23 DIAGNOSIS — K5900 Constipation, unspecified: Secondary | ICD-10-CM | POA: Diagnosis not present

## 2016-11-23 DIAGNOSIS — M545 Low back pain: Secondary | ICD-10-CM | POA: Diagnosis not present

## 2016-11-23 DIAGNOSIS — R140 Abdominal distension (gaseous): Secondary | ICD-10-CM | POA: Diagnosis not present

## 2016-11-23 DIAGNOSIS — R1084 Generalized abdominal pain: Secondary | ICD-10-CM | POA: Diagnosis not present

## 2016-11-23 DIAGNOSIS — R1310 Dysphagia, unspecified: Secondary | ICD-10-CM | POA: Diagnosis present

## 2016-11-23 DIAGNOSIS — F17210 Nicotine dependence, cigarettes, uncomplicated: Secondary | ICD-10-CM | POA: Diagnosis present

## 2016-11-23 DIAGNOSIS — J168 Pneumonia due to other specified infectious organisms: Secondary | ICD-10-CM | POA: Diagnosis not present

## 2016-11-23 DIAGNOSIS — M25559 Pain in unspecified hip: Secondary | ICD-10-CM | POA: Diagnosis not present

## 2016-11-23 DIAGNOSIS — J189 Pneumonia, unspecified organism: Secondary | ICD-10-CM | POA: Diagnosis present

## 2016-11-23 DIAGNOSIS — R0600 Dyspnea, unspecified: Secondary | ICD-10-CM | POA: Diagnosis not present

## 2016-11-23 DIAGNOSIS — M4854XA Collapsed vertebra, not elsewhere classified, thoracic region, initial encounter for fracture: Secondary | ICD-10-CM | POA: Diagnosis not present

## 2016-11-23 DIAGNOSIS — M25551 Pain in right hip: Secondary | ICD-10-CM | POA: Diagnosis not present

## 2016-11-23 DIAGNOSIS — J9690 Respiratory failure, unspecified, unspecified whether with hypoxia or hypercapnia: Secondary | ICD-10-CM | POA: Diagnosis not present

## 2016-11-23 DIAGNOSIS — R102 Pelvic and perineal pain: Secondary | ICD-10-CM | POA: Diagnosis not present

## 2016-11-23 DIAGNOSIS — R0603 Acute respiratory distress: Secondary | ICD-10-CM | POA: Diagnosis not present

## 2016-11-23 DIAGNOSIS — K219 Gastro-esophageal reflux disease without esophagitis: Secondary | ICD-10-CM | POA: Diagnosis present

## 2016-11-23 DIAGNOSIS — J449 Chronic obstructive pulmonary disease, unspecified: Secondary | ICD-10-CM | POA: Diagnosis not present

## 2016-11-23 DIAGNOSIS — M25552 Pain in left hip: Secondary | ICD-10-CM | POA: Diagnosis not present

## 2016-11-23 DIAGNOSIS — M4856XA Collapsed vertebra, not elsewhere classified, lumbar region, initial encounter for fracture: Secondary | ICD-10-CM | POA: Diagnosis not present

## 2016-11-23 DIAGNOSIS — J440 Chronic obstructive pulmonary disease with acute lower respiratory infection: Secondary | ICD-10-CM | POA: Diagnosis present

## 2016-11-23 DIAGNOSIS — J9611 Chronic respiratory failure with hypoxia: Secondary | ICD-10-CM | POA: Diagnosis present

## 2016-11-23 DIAGNOSIS — R079 Chest pain, unspecified: Secondary | ICD-10-CM | POA: Diagnosis not present

## 2016-11-23 DIAGNOSIS — J441 Chronic obstructive pulmonary disease with (acute) exacerbation: Secondary | ICD-10-CM | POA: Diagnosis not present

## 2016-11-28 DIAGNOSIS — S32010D Wedge compression fracture of first lumbar vertebra, subsequent encounter for fracture with routine healing: Secondary | ICD-10-CM | POA: Diagnosis not present

## 2016-11-28 DIAGNOSIS — S22050D Wedge compression fracture of T5-T6 vertebra, subsequent encounter for fracture with routine healing: Secondary | ICD-10-CM | POA: Diagnosis not present

## 2016-11-28 DIAGNOSIS — J440 Chronic obstructive pulmonary disease with acute lower respiratory infection: Secondary | ICD-10-CM | POA: Diagnosis not present

## 2016-11-28 DIAGNOSIS — S22080D Wedge compression fracture of T11-T12 vertebra, subsequent encounter for fracture with routine healing: Secondary | ICD-10-CM | POA: Diagnosis not present

## 2016-11-28 DIAGNOSIS — Z9981 Dependence on supplemental oxygen: Secondary | ICD-10-CM | POA: Diagnosis not present

## 2016-11-28 DIAGNOSIS — S22060D Wedge compression fracture of T7-T8 vertebra, subsequent encounter for fracture with routine healing: Secondary | ICD-10-CM | POA: Diagnosis not present

## 2016-11-28 DIAGNOSIS — J189 Pneumonia, unspecified organism: Secondary | ICD-10-CM | POA: Diagnosis not present

## 2016-11-28 DIAGNOSIS — Z720 Tobacco use: Secondary | ICD-10-CM | POA: Diagnosis not present

## 2016-11-30 DIAGNOSIS — S22060D Wedge compression fracture of T7-T8 vertebra, subsequent encounter for fracture with routine healing: Secondary | ICD-10-CM | POA: Diagnosis not present

## 2016-11-30 DIAGNOSIS — J440 Chronic obstructive pulmonary disease with acute lower respiratory infection: Secondary | ICD-10-CM | POA: Diagnosis not present

## 2016-11-30 DIAGNOSIS — J189 Pneumonia, unspecified organism: Secondary | ICD-10-CM | POA: Diagnosis not present

## 2016-11-30 DIAGNOSIS — S32010D Wedge compression fracture of first lumbar vertebra, subsequent encounter for fracture with routine healing: Secondary | ICD-10-CM | POA: Diagnosis not present

## 2016-11-30 DIAGNOSIS — S22080D Wedge compression fracture of T11-T12 vertebra, subsequent encounter for fracture with routine healing: Secondary | ICD-10-CM | POA: Diagnosis not present

## 2016-11-30 DIAGNOSIS — S22050D Wedge compression fracture of T5-T6 vertebra, subsequent encounter for fracture with routine healing: Secondary | ICD-10-CM | POA: Diagnosis not present

## 2016-12-02 DIAGNOSIS — S22080D Wedge compression fracture of T11-T12 vertebra, subsequent encounter for fracture with routine healing: Secondary | ICD-10-CM | POA: Diagnosis not present

## 2016-12-02 DIAGNOSIS — J440 Chronic obstructive pulmonary disease with acute lower respiratory infection: Secondary | ICD-10-CM | POA: Diagnosis not present

## 2016-12-02 DIAGNOSIS — S22060D Wedge compression fracture of T7-T8 vertebra, subsequent encounter for fracture with routine healing: Secondary | ICD-10-CM | POA: Diagnosis not present

## 2016-12-02 DIAGNOSIS — S22050D Wedge compression fracture of T5-T6 vertebra, subsequent encounter for fracture with routine healing: Secondary | ICD-10-CM | POA: Diagnosis not present

## 2016-12-02 DIAGNOSIS — J189 Pneumonia, unspecified organism: Secondary | ICD-10-CM | POA: Diagnosis not present

## 2016-12-02 DIAGNOSIS — S32010D Wedge compression fracture of first lumbar vertebra, subsequent encounter for fracture with routine healing: Secondary | ICD-10-CM | POA: Diagnosis not present

## 2016-12-06 DIAGNOSIS — S22050D Wedge compression fracture of T5-T6 vertebra, subsequent encounter for fracture with routine healing: Secondary | ICD-10-CM | POA: Diagnosis not present

## 2016-12-06 DIAGNOSIS — J189 Pneumonia, unspecified organism: Secondary | ICD-10-CM | POA: Diagnosis not present

## 2016-12-06 DIAGNOSIS — S22060D Wedge compression fracture of T7-T8 vertebra, subsequent encounter for fracture with routine healing: Secondary | ICD-10-CM | POA: Diagnosis not present

## 2016-12-06 DIAGNOSIS — S32010D Wedge compression fracture of first lumbar vertebra, subsequent encounter for fracture with routine healing: Secondary | ICD-10-CM | POA: Diagnosis not present

## 2016-12-06 DIAGNOSIS — J440 Chronic obstructive pulmonary disease with acute lower respiratory infection: Secondary | ICD-10-CM | POA: Diagnosis not present

## 2016-12-06 DIAGNOSIS — S22080D Wedge compression fracture of T11-T12 vertebra, subsequent encounter for fracture with routine healing: Secondary | ICD-10-CM | POA: Diagnosis not present

## 2016-12-10 DIAGNOSIS — S32010D Wedge compression fracture of first lumbar vertebra, subsequent encounter for fracture with routine healing: Secondary | ICD-10-CM | POA: Diagnosis not present

## 2016-12-10 DIAGNOSIS — S22050D Wedge compression fracture of T5-T6 vertebra, subsequent encounter for fracture with routine healing: Secondary | ICD-10-CM | POA: Diagnosis not present

## 2016-12-10 DIAGNOSIS — S22080D Wedge compression fracture of T11-T12 vertebra, subsequent encounter for fracture with routine healing: Secondary | ICD-10-CM | POA: Diagnosis not present

## 2016-12-10 DIAGNOSIS — S22060D Wedge compression fracture of T7-T8 vertebra, subsequent encounter for fracture with routine healing: Secondary | ICD-10-CM | POA: Diagnosis not present

## 2016-12-10 DIAGNOSIS — J440 Chronic obstructive pulmonary disease with acute lower respiratory infection: Secondary | ICD-10-CM | POA: Diagnosis not present

## 2016-12-10 DIAGNOSIS — J189 Pneumonia, unspecified organism: Secondary | ICD-10-CM | POA: Diagnosis not present

## 2016-12-11 ENCOUNTER — Other Ambulatory Visit: Payer: Self-pay | Admitting: Family

## 2016-12-11 DIAGNOSIS — J189 Pneumonia, unspecified organism: Secondary | ICD-10-CM | POA: Diagnosis not present

## 2016-12-11 DIAGNOSIS — Z716 Tobacco abuse counseling: Secondary | ICD-10-CM

## 2016-12-11 DIAGNOSIS — S32010D Wedge compression fracture of first lumbar vertebra, subsequent encounter for fracture with routine healing: Secondary | ICD-10-CM | POA: Diagnosis not present

## 2016-12-11 DIAGNOSIS — F17200 Nicotine dependence, unspecified, uncomplicated: Secondary | ICD-10-CM

## 2016-12-11 DIAGNOSIS — J449 Chronic obstructive pulmonary disease, unspecified: Secondary | ICD-10-CM

## 2016-12-11 DIAGNOSIS — S22060D Wedge compression fracture of T7-T8 vertebra, subsequent encounter for fracture with routine healing: Secondary | ICD-10-CM | POA: Diagnosis not present

## 2016-12-11 DIAGNOSIS — J441 Chronic obstructive pulmonary disease with (acute) exacerbation: Secondary | ICD-10-CM

## 2016-12-11 DIAGNOSIS — J440 Chronic obstructive pulmonary disease with acute lower respiratory infection: Secondary | ICD-10-CM | POA: Diagnosis not present

## 2016-12-11 DIAGNOSIS — S22080D Wedge compression fracture of T11-T12 vertebra, subsequent encounter for fracture with routine healing: Secondary | ICD-10-CM | POA: Diagnosis not present

## 2016-12-11 DIAGNOSIS — S22050D Wedge compression fracture of T5-T6 vertebra, subsequent encounter for fracture with routine healing: Secondary | ICD-10-CM | POA: Diagnosis not present

## 2016-12-13 DIAGNOSIS — S22050D Wedge compression fracture of T5-T6 vertebra, subsequent encounter for fracture with routine healing: Secondary | ICD-10-CM | POA: Diagnosis not present

## 2016-12-13 DIAGNOSIS — J189 Pneumonia, unspecified organism: Secondary | ICD-10-CM | POA: Diagnosis not present

## 2016-12-13 DIAGNOSIS — S22080D Wedge compression fracture of T11-T12 vertebra, subsequent encounter for fracture with routine healing: Secondary | ICD-10-CM | POA: Diagnosis not present

## 2016-12-13 DIAGNOSIS — J440 Chronic obstructive pulmonary disease with acute lower respiratory infection: Secondary | ICD-10-CM | POA: Diagnosis not present

## 2016-12-13 DIAGNOSIS — S22060D Wedge compression fracture of T7-T8 vertebra, subsequent encounter for fracture with routine healing: Secondary | ICD-10-CM | POA: Diagnosis not present

## 2016-12-13 DIAGNOSIS — S32010D Wedge compression fracture of first lumbar vertebra, subsequent encounter for fracture with routine healing: Secondary | ICD-10-CM | POA: Diagnosis not present

## 2016-12-16 ENCOUNTER — Ambulatory Visit (INDEPENDENT_AMBULATORY_CARE_PROVIDER_SITE_OTHER): Payer: Medicare Other | Admitting: Family Medicine

## 2016-12-16 DIAGNOSIS — Z9981 Dependence on supplemental oxygen: Secondary | ICD-10-CM | POA: Diagnosis not present

## 2016-12-16 DIAGNOSIS — M545 Low back pain: Secondary | ICD-10-CM | POA: Diagnosis not present

## 2016-12-16 DIAGNOSIS — M25561 Pain in right knee: Secondary | ICD-10-CM | POA: Diagnosis not present

## 2016-12-16 DIAGNOSIS — M79605 Pain in left leg: Secondary | ICD-10-CM | POA: Diagnosis not present

## 2016-12-16 DIAGNOSIS — M79661 Pain in right lower leg: Secondary | ICD-10-CM | POA: Diagnosis not present

## 2016-12-16 DIAGNOSIS — M79604 Pain in right leg: Secondary | ICD-10-CM | POA: Diagnosis not present

## 2016-12-16 DIAGNOSIS — J449 Chronic obstructive pulmonary disease, unspecified: Secondary | ICD-10-CM | POA: Diagnosis not present

## 2016-12-16 DIAGNOSIS — F17210 Nicotine dependence, cigarettes, uncomplicated: Secondary | ICD-10-CM

## 2016-12-16 DIAGNOSIS — M25562 Pain in left knee: Secondary | ICD-10-CM | POA: Diagnosis not present

## 2016-12-16 DIAGNOSIS — G894 Chronic pain syndrome: Secondary | ICD-10-CM | POA: Diagnosis not present

## 2016-12-16 DIAGNOSIS — M79662 Pain in left lower leg: Secondary | ICD-10-CM | POA: Diagnosis not present

## 2016-12-18 DIAGNOSIS — S22080D Wedge compression fracture of T11-T12 vertebra, subsequent encounter for fracture with routine healing: Secondary | ICD-10-CM | POA: Diagnosis not present

## 2016-12-18 DIAGNOSIS — S22050D Wedge compression fracture of T5-T6 vertebra, subsequent encounter for fracture with routine healing: Secondary | ICD-10-CM | POA: Diagnosis not present

## 2016-12-18 DIAGNOSIS — S32010D Wedge compression fracture of first lumbar vertebra, subsequent encounter for fracture with routine healing: Secondary | ICD-10-CM | POA: Diagnosis not present

## 2016-12-18 DIAGNOSIS — J189 Pneumonia, unspecified organism: Secondary | ICD-10-CM | POA: Diagnosis not present

## 2016-12-18 DIAGNOSIS — J440 Chronic obstructive pulmonary disease with acute lower respiratory infection: Secondary | ICD-10-CM | POA: Diagnosis not present

## 2016-12-18 DIAGNOSIS — S22060D Wedge compression fracture of T7-T8 vertebra, subsequent encounter for fracture with routine healing: Secondary | ICD-10-CM | POA: Diagnosis not present

## 2017-01-04 DIAGNOSIS — R0602 Shortness of breath: Secondary | ICD-10-CM | POA: Diagnosis not present

## 2017-01-04 DIAGNOSIS — J441 Chronic obstructive pulmonary disease with (acute) exacerbation: Secondary | ICD-10-CM | POA: Diagnosis not present

## 2017-01-04 DIAGNOSIS — X58XXXA Exposure to other specified factors, initial encounter: Secondary | ICD-10-CM | POA: Diagnosis not present

## 2017-01-04 DIAGNOSIS — R42 Dizziness and giddiness: Secondary | ICD-10-CM | POA: Diagnosis not present

## 2017-01-04 DIAGNOSIS — F17210 Nicotine dependence, cigarettes, uncomplicated: Secondary | ICD-10-CM | POA: Diagnosis not present

## 2017-01-04 DIAGNOSIS — R05 Cough: Secondary | ICD-10-CM | POA: Diagnosis not present

## 2017-01-04 DIAGNOSIS — S0990XA Unspecified injury of head, initial encounter: Secondary | ICD-10-CM | POA: Diagnosis not present

## 2017-01-04 DIAGNOSIS — R9431 Abnormal electrocardiogram [ECG] [EKG]: Secondary | ICD-10-CM | POA: Diagnosis not present

## 2017-01-04 DIAGNOSIS — R5383 Other fatigue: Secondary | ICD-10-CM | POA: Diagnosis not present

## 2017-01-07 DIAGNOSIS — S22080D Wedge compression fracture of T11-T12 vertebra, subsequent encounter for fracture with routine healing: Secondary | ICD-10-CM | POA: Diagnosis not present

## 2017-01-07 DIAGNOSIS — S22060D Wedge compression fracture of T7-T8 vertebra, subsequent encounter for fracture with routine healing: Secondary | ICD-10-CM | POA: Diagnosis not present

## 2017-01-07 DIAGNOSIS — S32010D Wedge compression fracture of first lumbar vertebra, subsequent encounter for fracture with routine healing: Secondary | ICD-10-CM | POA: Diagnosis not present

## 2017-01-07 DIAGNOSIS — J189 Pneumonia, unspecified organism: Secondary | ICD-10-CM | POA: Diagnosis not present

## 2017-01-07 DIAGNOSIS — J440 Chronic obstructive pulmonary disease with acute lower respiratory infection: Secondary | ICD-10-CM | POA: Diagnosis not present

## 2017-01-07 DIAGNOSIS — S22050D Wedge compression fracture of T5-T6 vertebra, subsequent encounter for fracture with routine healing: Secondary | ICD-10-CM | POA: Diagnosis not present

## 2017-01-10 DIAGNOSIS — S32010A Wedge compression fracture of first lumbar vertebra, initial encounter for closed fracture: Secondary | ICD-10-CM | POA: Diagnosis not present

## 2017-01-10 DIAGNOSIS — J441 Chronic obstructive pulmonary disease with (acute) exacerbation: Secondary | ICD-10-CM | POA: Diagnosis not present

## 2017-01-10 DIAGNOSIS — F320 Major depressive disorder, single episode, mild: Secondary | ICD-10-CM | POA: Diagnosis not present

## 2017-01-10 DIAGNOSIS — F17200 Nicotine dependence, unspecified, uncomplicated: Secondary | ICD-10-CM | POA: Diagnosis not present

## 2017-01-10 DIAGNOSIS — K219 Gastro-esophageal reflux disease without esophagitis: Secondary | ICD-10-CM | POA: Diagnosis not present

## 2017-01-10 DIAGNOSIS — M810 Age-related osteoporosis without current pathological fracture: Secondary | ICD-10-CM | POA: Diagnosis not present

## 2017-01-23 DIAGNOSIS — S22080D Wedge compression fracture of T11-T12 vertebra, subsequent encounter for fracture with routine healing: Secondary | ICD-10-CM | POA: Diagnosis not present

## 2017-01-23 DIAGNOSIS — J440 Chronic obstructive pulmonary disease with acute lower respiratory infection: Secondary | ICD-10-CM | POA: Diagnosis not present

## 2017-01-23 DIAGNOSIS — S22060D Wedge compression fracture of T7-T8 vertebra, subsequent encounter for fracture with routine healing: Secondary | ICD-10-CM | POA: Diagnosis not present

## 2017-01-23 DIAGNOSIS — J189 Pneumonia, unspecified organism: Secondary | ICD-10-CM | POA: Diagnosis not present

## 2017-01-23 DIAGNOSIS — S32010D Wedge compression fracture of first lumbar vertebra, subsequent encounter for fracture with routine healing: Secondary | ICD-10-CM | POA: Diagnosis not present

## 2017-01-23 DIAGNOSIS — S22050D Wedge compression fracture of T5-T6 vertebra, subsequent encounter for fracture with routine healing: Secondary | ICD-10-CM | POA: Diagnosis not present

## 2017-01-24 DIAGNOSIS — R5381 Other malaise: Secondary | ICD-10-CM | POA: Diagnosis not present

## 2017-01-24 DIAGNOSIS — S32010S Wedge compression fracture of first lumbar vertebra, sequela: Secondary | ICD-10-CM | POA: Diagnosis not present

## 2017-01-24 DIAGNOSIS — S22000S Wedge compression fracture of unspecified thoracic vertebra, sequela: Secondary | ICD-10-CM | POA: Diagnosis not present

## 2017-01-24 DIAGNOSIS — G8929 Other chronic pain: Secondary | ICD-10-CM | POA: Diagnosis not present

## 2017-02-07 DIAGNOSIS — R2681 Unsteadiness on feet: Secondary | ICD-10-CM | POA: Diagnosis not present

## 2017-02-07 DIAGNOSIS — J449 Chronic obstructive pulmonary disease, unspecified: Secondary | ICD-10-CM | POA: Diagnosis not present

## 2017-02-07 DIAGNOSIS — F329 Major depressive disorder, single episode, unspecified: Secondary | ICD-10-CM | POA: Diagnosis not present

## 2017-02-07 DIAGNOSIS — M545 Low back pain: Secondary | ICD-10-CM | POA: Diagnosis not present

## 2017-02-07 DIAGNOSIS — Z87891 Personal history of nicotine dependence: Secondary | ICD-10-CM | POA: Diagnosis not present

## 2017-02-11 DIAGNOSIS — R0602 Shortness of breath: Secondary | ICD-10-CM | POA: Diagnosis not present

## 2017-02-11 DIAGNOSIS — J449 Chronic obstructive pulmonary disease, unspecified: Secondary | ICD-10-CM | POA: Diagnosis not present

## 2017-02-11 DIAGNOSIS — S22069A Unspecified fracture of T7-T8 vertebra, initial encounter for closed fracture: Secondary | ICD-10-CM | POA: Diagnosis not present

## 2017-02-11 DIAGNOSIS — R079 Chest pain, unspecified: Secondary | ICD-10-CM | POA: Diagnosis not present

## 2017-02-11 DIAGNOSIS — F17210 Nicotine dependence, cigarettes, uncomplicated: Secondary | ICD-10-CM | POA: Diagnosis not present

## 2017-02-11 DIAGNOSIS — S22059A Unspecified fracture of T5-T6 vertebra, initial encounter for closed fracture: Secondary | ICD-10-CM | POA: Diagnosis not present

## 2017-02-11 DIAGNOSIS — M8088XA Other osteoporosis with current pathological fracture, vertebra(e), initial encounter for fracture: Secondary | ICD-10-CM | POA: Diagnosis not present

## 2017-02-11 DIAGNOSIS — R05 Cough: Secondary | ICD-10-CM | POA: Diagnosis not present

## 2017-02-11 DIAGNOSIS — J439 Emphysema, unspecified: Secondary | ICD-10-CM | POA: Diagnosis not present

## 2017-02-17 DIAGNOSIS — J449 Chronic obstructive pulmonary disease, unspecified: Secondary | ICD-10-CM | POA: Diagnosis not present

## 2017-02-17 DIAGNOSIS — F329 Major depressive disorder, single episode, unspecified: Secondary | ICD-10-CM | POA: Diagnosis not present

## 2017-02-17 DIAGNOSIS — M545 Low back pain: Secondary | ICD-10-CM | POA: Diagnosis not present

## 2017-02-17 DIAGNOSIS — Z87891 Personal history of nicotine dependence: Secondary | ICD-10-CM | POA: Diagnosis not present

## 2017-02-17 DIAGNOSIS — R2681 Unsteadiness on feet: Secondary | ICD-10-CM | POA: Diagnosis not present

## 2017-02-21 DIAGNOSIS — J441 Chronic obstructive pulmonary disease with (acute) exacerbation: Secondary | ICD-10-CM | POA: Diagnosis not present

## 2017-02-25 DIAGNOSIS — M546 Pain in thoracic spine: Secondary | ICD-10-CM | POA: Diagnosis not present

## 2017-02-25 DIAGNOSIS — S22000A Wedge compression fracture of unspecified thoracic vertebra, initial encounter for closed fracture: Secondary | ICD-10-CM | POA: Diagnosis not present

## 2017-02-25 DIAGNOSIS — S32010A Wedge compression fracture of first lumbar vertebra, initial encounter for closed fracture: Secondary | ICD-10-CM | POA: Diagnosis not present

## 2017-04-01 DIAGNOSIS — J441 Chronic obstructive pulmonary disease with (acute) exacerbation: Secondary | ICD-10-CM | POA: Diagnosis not present

## 2017-04-01 DIAGNOSIS — K219 Gastro-esophageal reflux disease without esophagitis: Secondary | ICD-10-CM | POA: Diagnosis not present

## 2017-05-01 DIAGNOSIS — M545 Low back pain: Secondary | ICD-10-CM | POA: Diagnosis not present

## 2017-05-01 DIAGNOSIS — G8929 Other chronic pain: Secondary | ICD-10-CM | POA: Diagnosis not present

## 2017-05-01 DIAGNOSIS — J431 Panlobular emphysema: Secondary | ICD-10-CM | POA: Diagnosis not present

## 2017-08-21 DIAGNOSIS — K429 Umbilical hernia without obstruction or gangrene: Secondary | ICD-10-CM | POA: Diagnosis not present

## 2017-08-29 ENCOUNTER — Other Ambulatory Visit: Payer: Self-pay | Admitting: Family

## 2017-09-11 DIAGNOSIS — F17210 Nicotine dependence, cigarettes, uncomplicated: Secondary | ICD-10-CM | POA: Diagnosis not present

## 2017-09-11 DIAGNOSIS — K429 Umbilical hernia without obstruction or gangrene: Secondary | ICD-10-CM | POA: Diagnosis not present

## 2017-09-18 DIAGNOSIS — F320 Major depressive disorder, single episode, mild: Secondary | ICD-10-CM | POA: Diagnosis not present

## 2017-09-18 DIAGNOSIS — B372 Candidiasis of skin and nail: Secondary | ICD-10-CM | POA: Diagnosis not present

## 2017-09-18 DIAGNOSIS — Z23 Encounter for immunization: Secondary | ICD-10-CM | POA: Diagnosis not present

## 2017-09-18 DIAGNOSIS — J431 Panlobular emphysema: Secondary | ICD-10-CM | POA: Diagnosis not present

## 2017-09-18 DIAGNOSIS — M545 Low back pain: Secondary | ICD-10-CM | POA: Diagnosis not present

## 2017-09-18 DIAGNOSIS — G8929 Other chronic pain: Secondary | ICD-10-CM | POA: Diagnosis not present

## 2017-09-18 DIAGNOSIS — M810 Age-related osteoporosis without current pathological fracture: Secondary | ICD-10-CM | POA: Diagnosis not present

## 2017-10-30 DIAGNOSIS — R918 Other nonspecific abnormal finding of lung field: Secondary | ICD-10-CM | POA: Diagnosis not present

## 2017-10-30 DIAGNOSIS — J441 Chronic obstructive pulmonary disease with (acute) exacerbation: Secondary | ICD-10-CM | POA: Diagnosis not present

## 2017-10-30 DIAGNOSIS — R079 Chest pain, unspecified: Secondary | ICD-10-CM | POA: Diagnosis not present

## 2017-10-30 DIAGNOSIS — R0602 Shortness of breath: Secondary | ICD-10-CM | POA: Diagnosis not present

## 2017-10-30 DIAGNOSIS — J449 Chronic obstructive pulmonary disease, unspecified: Secondary | ICD-10-CM | POA: Diagnosis not present

## 2017-10-30 DIAGNOSIS — Z87891 Personal history of nicotine dependence: Secondary | ICD-10-CM | POA: Diagnosis not present

## 2017-10-30 DIAGNOSIS — R0600 Dyspnea, unspecified: Secondary | ICD-10-CM | POA: Diagnosis not present

## 2017-12-12 DIAGNOSIS — Z79899 Other long term (current) drug therapy: Secondary | ICD-10-CM | POA: Diagnosis not present

## 2017-12-25 DIAGNOSIS — Z87891 Personal history of nicotine dependence: Secondary | ICD-10-CM | POA: Diagnosis not present

## 2017-12-25 DIAGNOSIS — Z79899 Other long term (current) drug therapy: Secondary | ICD-10-CM | POA: Diagnosis not present

## 2017-12-25 DIAGNOSIS — J189 Pneumonia, unspecified organism: Secondary | ICD-10-CM | POA: Diagnosis not present

## 2017-12-25 DIAGNOSIS — Z9981 Dependence on supplemental oxygen: Secondary | ICD-10-CM | POA: Diagnosis not present

## 2017-12-25 DIAGNOSIS — J9601 Acute respiratory failure with hypoxia: Secondary | ICD-10-CM | POA: Diagnosis not present

## 2017-12-25 DIAGNOSIS — F329 Major depressive disorder, single episode, unspecified: Secondary | ICD-10-CM | POA: Diagnosis not present

## 2017-12-25 DIAGNOSIS — M545 Low back pain: Secondary | ICD-10-CM | POA: Diagnosis not present

## 2017-12-25 DIAGNOSIS — J441 Chronic obstructive pulmonary disease with (acute) exacerbation: Secondary | ICD-10-CM | POA: Diagnosis not present

## 2017-12-25 DIAGNOSIS — J439 Emphysema, unspecified: Secondary | ICD-10-CM | POA: Diagnosis not present

## 2017-12-25 DIAGNOSIS — R0602 Shortness of breath: Secondary | ICD-10-CM | POA: Diagnosis not present

## 2017-12-25 DIAGNOSIS — I50812 Chronic right heart failure: Secondary | ICD-10-CM | POA: Diagnosis present

## 2017-12-25 DIAGNOSIS — Z7951 Long term (current) use of inhaled steroids: Secondary | ICD-10-CM | POA: Diagnosis not present

## 2017-12-25 DIAGNOSIS — G8929 Other chronic pain: Secondary | ICD-10-CM | POA: Diagnosis not present

## 2017-12-25 DIAGNOSIS — M810 Age-related osteoporosis without current pathological fracture: Secondary | ICD-10-CM | POA: Diagnosis present

## 2017-12-25 DIAGNOSIS — R079 Chest pain, unspecified: Secondary | ICD-10-CM | POA: Diagnosis not present

## 2017-12-25 DIAGNOSIS — K219 Gastro-esophageal reflux disease without esophagitis: Secondary | ICD-10-CM | POA: Diagnosis not present

## 2017-12-25 DIAGNOSIS — Z66 Do not resuscitate: Secondary | ICD-10-CM | POA: Diagnosis present

## 2017-12-25 DIAGNOSIS — Z8249 Family history of ischemic heart disease and other diseases of the circulatory system: Secondary | ICD-10-CM | POA: Diagnosis not present

## 2017-12-25 DIAGNOSIS — Z79891 Long term (current) use of opiate analgesic: Secondary | ICD-10-CM | POA: Diagnosis not present

## 2017-12-25 DIAGNOSIS — Z833 Family history of diabetes mellitus: Secondary | ICD-10-CM | POA: Diagnosis not present

## 2018-01-08 DIAGNOSIS — M810 Age-related osteoporosis without current pathological fracture: Secondary | ICD-10-CM | POA: Diagnosis not present

## 2018-01-08 DIAGNOSIS — J432 Centrilobular emphysema: Secondary | ICD-10-CM | POA: Diagnosis not present

## 2018-01-08 DIAGNOSIS — G8929 Other chronic pain: Secondary | ICD-10-CM | POA: Diagnosis not present

## 2018-01-08 DIAGNOSIS — F320 Major depressive disorder, single episode, mild: Secondary | ICD-10-CM | POA: Diagnosis not present

## 2018-01-08 DIAGNOSIS — Z79899 Other long term (current) drug therapy: Secondary | ICD-10-CM | POA: Diagnosis not present

## 2018-01-08 DIAGNOSIS — M545 Low back pain: Secondary | ICD-10-CM | POA: Diagnosis not present

## 2018-01-22 DIAGNOSIS — J432 Centrilobular emphysema: Secondary | ICD-10-CM | POA: Diagnosis not present

## 2018-01-22 DIAGNOSIS — G8929 Other chronic pain: Secondary | ICD-10-CM | POA: Diagnosis not present

## 2018-01-22 DIAGNOSIS — M545 Low back pain: Secondary | ICD-10-CM | POA: Diagnosis not present

## 2018-02-20 DIAGNOSIS — J449 Chronic obstructive pulmonary disease, unspecified: Secondary | ICD-10-CM | POA: Diagnosis not present

## 2018-02-20 DIAGNOSIS — R1310 Dysphagia, unspecified: Secondary | ICD-10-CM | POA: Diagnosis not present

## 2018-02-20 DIAGNOSIS — R0609 Other forms of dyspnea: Secondary | ICD-10-CM | POA: Diagnosis not present

## 2018-02-20 DIAGNOSIS — M810 Age-related osteoporosis without current pathological fracture: Secondary | ICD-10-CM | POA: Diagnosis not present

## 2018-02-20 DIAGNOSIS — Z720 Tobacco use: Secondary | ICD-10-CM | POA: Diagnosis not present

## 2018-02-20 DIAGNOSIS — Z9981 Dependence on supplemental oxygen: Secondary | ICD-10-CM | POA: Diagnosis not present

## 2018-02-21 DIAGNOSIS — M810 Age-related osteoporosis without current pathological fracture: Secondary | ICD-10-CM | POA: Diagnosis not present

## 2018-02-21 DIAGNOSIS — R1310 Dysphagia, unspecified: Secondary | ICD-10-CM | POA: Diagnosis not present

## 2018-02-21 DIAGNOSIS — R0609 Other forms of dyspnea: Secondary | ICD-10-CM | POA: Diagnosis not present

## 2018-02-21 DIAGNOSIS — J449 Chronic obstructive pulmonary disease, unspecified: Secondary | ICD-10-CM | POA: Diagnosis not present

## 2018-02-21 DIAGNOSIS — Z9981 Dependence on supplemental oxygen: Secondary | ICD-10-CM | POA: Diagnosis not present

## 2018-02-21 DIAGNOSIS — Z720 Tobacco use: Secondary | ICD-10-CM | POA: Diagnosis not present

## 2018-02-23 DIAGNOSIS — R1310 Dysphagia, unspecified: Secondary | ICD-10-CM | POA: Diagnosis not present

## 2018-02-23 DIAGNOSIS — M810 Age-related osteoporosis without current pathological fracture: Secondary | ICD-10-CM | POA: Diagnosis not present

## 2018-02-23 DIAGNOSIS — J449 Chronic obstructive pulmonary disease, unspecified: Secondary | ICD-10-CM | POA: Diagnosis not present

## 2018-02-23 DIAGNOSIS — R0609 Other forms of dyspnea: Secondary | ICD-10-CM | POA: Diagnosis not present

## 2018-02-23 DIAGNOSIS — Z9981 Dependence on supplemental oxygen: Secondary | ICD-10-CM | POA: Diagnosis not present

## 2018-02-23 DIAGNOSIS — Z720 Tobacco use: Secondary | ICD-10-CM | POA: Diagnosis not present

## 2018-02-24 DIAGNOSIS — R0609 Other forms of dyspnea: Secondary | ICD-10-CM | POA: Diagnosis not present

## 2018-02-24 DIAGNOSIS — Z720 Tobacco use: Secondary | ICD-10-CM | POA: Diagnosis not present

## 2018-02-24 DIAGNOSIS — Z9981 Dependence on supplemental oxygen: Secondary | ICD-10-CM | POA: Diagnosis not present

## 2018-02-24 DIAGNOSIS — R1310 Dysphagia, unspecified: Secondary | ICD-10-CM | POA: Diagnosis not present

## 2018-02-24 DIAGNOSIS — M810 Age-related osteoporosis without current pathological fracture: Secondary | ICD-10-CM | POA: Diagnosis not present

## 2018-02-24 DIAGNOSIS — J449 Chronic obstructive pulmonary disease, unspecified: Secondary | ICD-10-CM | POA: Diagnosis not present

## 2018-02-25 DIAGNOSIS — R1310 Dysphagia, unspecified: Secondary | ICD-10-CM | POA: Diagnosis not present

## 2018-02-25 DIAGNOSIS — Z720 Tobacco use: Secondary | ICD-10-CM | POA: Diagnosis not present

## 2018-02-25 DIAGNOSIS — J449 Chronic obstructive pulmonary disease, unspecified: Secondary | ICD-10-CM | POA: Diagnosis not present

## 2018-02-25 DIAGNOSIS — M810 Age-related osteoporosis without current pathological fracture: Secondary | ICD-10-CM | POA: Diagnosis not present

## 2018-02-25 DIAGNOSIS — R0609 Other forms of dyspnea: Secondary | ICD-10-CM | POA: Diagnosis not present

## 2018-02-25 DIAGNOSIS — Z9981 Dependence on supplemental oxygen: Secondary | ICD-10-CM | POA: Diagnosis not present

## 2018-02-27 DIAGNOSIS — R1310 Dysphagia, unspecified: Secondary | ICD-10-CM | POA: Diagnosis not present

## 2018-02-27 DIAGNOSIS — J449 Chronic obstructive pulmonary disease, unspecified: Secondary | ICD-10-CM | POA: Diagnosis not present

## 2018-02-27 DIAGNOSIS — R0609 Other forms of dyspnea: Secondary | ICD-10-CM | POA: Diagnosis not present

## 2018-02-27 DIAGNOSIS — M810 Age-related osteoporosis without current pathological fracture: Secondary | ICD-10-CM | POA: Diagnosis not present

## 2018-02-27 DIAGNOSIS — Z9981 Dependence on supplemental oxygen: Secondary | ICD-10-CM | POA: Diagnosis not present

## 2018-02-27 DIAGNOSIS — Z720 Tobacco use: Secondary | ICD-10-CM | POA: Diagnosis not present

## 2018-03-02 DIAGNOSIS — J449 Chronic obstructive pulmonary disease, unspecified: Secondary | ICD-10-CM | POA: Diagnosis not present

## 2018-03-02 DIAGNOSIS — M810 Age-related osteoporosis without current pathological fracture: Secondary | ICD-10-CM | POA: Diagnosis not present

## 2018-03-02 DIAGNOSIS — R1310 Dysphagia, unspecified: Secondary | ICD-10-CM | POA: Diagnosis not present

## 2018-03-02 DIAGNOSIS — R0609 Other forms of dyspnea: Secondary | ICD-10-CM | POA: Diagnosis not present

## 2018-03-02 DIAGNOSIS — Z720 Tobacco use: Secondary | ICD-10-CM | POA: Diagnosis not present

## 2018-03-02 DIAGNOSIS — Z9981 Dependence on supplemental oxygen: Secondary | ICD-10-CM | POA: Diagnosis not present

## 2018-03-03 DIAGNOSIS — Z720 Tobacco use: Secondary | ICD-10-CM | POA: Diagnosis not present

## 2018-03-03 DIAGNOSIS — M810 Age-related osteoporosis without current pathological fracture: Secondary | ICD-10-CM | POA: Diagnosis not present

## 2018-03-03 DIAGNOSIS — Z9981 Dependence on supplemental oxygen: Secondary | ICD-10-CM | POA: Diagnosis not present

## 2018-03-03 DIAGNOSIS — R1310 Dysphagia, unspecified: Secondary | ICD-10-CM | POA: Diagnosis not present

## 2018-03-03 DIAGNOSIS — R0609 Other forms of dyspnea: Secondary | ICD-10-CM | POA: Diagnosis not present

## 2018-03-03 DIAGNOSIS — J449 Chronic obstructive pulmonary disease, unspecified: Secondary | ICD-10-CM | POA: Diagnosis not present

## 2018-03-04 DIAGNOSIS — Z720 Tobacco use: Secondary | ICD-10-CM | POA: Diagnosis not present

## 2018-03-04 DIAGNOSIS — R1310 Dysphagia, unspecified: Secondary | ICD-10-CM | POA: Diagnosis not present

## 2018-03-04 DIAGNOSIS — M810 Age-related osteoporosis without current pathological fracture: Secondary | ICD-10-CM | POA: Diagnosis not present

## 2018-03-04 DIAGNOSIS — R0609 Other forms of dyspnea: Secondary | ICD-10-CM | POA: Diagnosis not present

## 2018-03-04 DIAGNOSIS — J449 Chronic obstructive pulmonary disease, unspecified: Secondary | ICD-10-CM | POA: Diagnosis not present

## 2018-03-04 DIAGNOSIS — Z9981 Dependence on supplemental oxygen: Secondary | ICD-10-CM | POA: Diagnosis not present

## 2018-03-06 DIAGNOSIS — R1310 Dysphagia, unspecified: Secondary | ICD-10-CM | POA: Diagnosis not present

## 2018-03-06 DIAGNOSIS — M810 Age-related osteoporosis without current pathological fracture: Secondary | ICD-10-CM | POA: Diagnosis not present

## 2018-03-06 DIAGNOSIS — Z720 Tobacco use: Secondary | ICD-10-CM | POA: Diagnosis not present

## 2018-03-06 DIAGNOSIS — R0609 Other forms of dyspnea: Secondary | ICD-10-CM | POA: Diagnosis not present

## 2018-03-06 DIAGNOSIS — Z9981 Dependence on supplemental oxygen: Secondary | ICD-10-CM | POA: Diagnosis not present

## 2018-03-06 DIAGNOSIS — J449 Chronic obstructive pulmonary disease, unspecified: Secondary | ICD-10-CM | POA: Diagnosis not present

## 2018-03-08 DIAGNOSIS — S82432A Displaced oblique fracture of shaft of left fibula, initial encounter for closed fracture: Secondary | ICD-10-CM | POA: Diagnosis not present

## 2018-03-08 DIAGNOSIS — S82452A Displaced comminuted fracture of shaft of left fibula, initial encounter for closed fracture: Secondary | ICD-10-CM | POA: Diagnosis not present

## 2018-03-08 DIAGNOSIS — Z743 Need for continuous supervision: Secondary | ICD-10-CM | POA: Diagnosis not present

## 2018-03-08 DIAGNOSIS — G8911 Acute pain due to trauma: Secondary | ICD-10-CM | POA: Diagnosis not present

## 2018-03-08 DIAGNOSIS — J9611 Chronic respiratory failure with hypoxia: Secondary | ICD-10-CM | POA: Diagnosis not present

## 2018-03-08 DIAGNOSIS — S82832A Other fracture of upper and lower end of left fibula, initial encounter for closed fracture: Secondary | ICD-10-CM | POA: Diagnosis not present

## 2018-03-08 DIAGNOSIS — I491 Atrial premature depolarization: Secondary | ICD-10-CM | POA: Diagnosis not present

## 2018-03-08 DIAGNOSIS — Z01818 Encounter for other preprocedural examination: Secondary | ICD-10-CM | POA: Diagnosis not present

## 2018-03-08 DIAGNOSIS — M79605 Pain in left leg: Secondary | ICD-10-CM | POA: Diagnosis not present

## 2018-03-08 DIAGNOSIS — S82422A Displaced transverse fracture of shaft of left fibula, initial encounter for closed fracture: Secondary | ICD-10-CM | POA: Diagnosis not present

## 2018-03-08 DIAGNOSIS — S82252A Displaced comminuted fracture of shaft of left tibia, initial encounter for closed fracture: Secondary | ICD-10-CM | POA: Diagnosis not present

## 2018-03-08 DIAGNOSIS — M810 Age-related osteoporosis without current pathological fracture: Secondary | ICD-10-CM | POA: Diagnosis not present

## 2018-03-08 DIAGNOSIS — S82245A Nondisplaced spiral fracture of shaft of left tibia, initial encounter for closed fracture: Secondary | ICD-10-CM | POA: Diagnosis not present

## 2018-03-08 DIAGNOSIS — M80062A Age-related osteoporosis with current pathological fracture, left lower leg, initial encounter for fracture: Secondary | ICD-10-CM | POA: Diagnosis not present

## 2018-03-08 DIAGNOSIS — R0609 Other forms of dyspnea: Secondary | ICD-10-CM | POA: Diagnosis not present

## 2018-03-08 DIAGNOSIS — D62 Acute posthemorrhagic anemia: Secondary | ICD-10-CM | POA: Diagnosis not present

## 2018-03-08 DIAGNOSIS — R1310 Dysphagia, unspecified: Secondary | ICD-10-CM | POA: Diagnosis not present

## 2018-03-08 DIAGNOSIS — Z01810 Encounter for preprocedural cardiovascular examination: Secondary | ICD-10-CM | POA: Diagnosis not present

## 2018-03-08 DIAGNOSIS — Z720 Tobacco use: Secondary | ICD-10-CM | POA: Diagnosis not present

## 2018-03-08 DIAGNOSIS — G8929 Other chronic pain: Secondary | ICD-10-CM | POA: Diagnosis not present

## 2018-03-08 DIAGNOSIS — Z9981 Dependence on supplemental oxygen: Secondary | ICD-10-CM | POA: Diagnosis not present

## 2018-03-08 DIAGNOSIS — J9612 Chronic respiratory failure with hypercapnia: Secondary | ICD-10-CM | POA: Diagnosis not present

## 2018-03-08 DIAGNOSIS — J449 Chronic obstructive pulmonary disease, unspecified: Secondary | ICD-10-CM | POA: Diagnosis not present

## 2018-03-08 DIAGNOSIS — M4850XA Collapsed vertebra, not elsewhere classified, site unspecified, initial encounter for fracture: Secondary | ICD-10-CM | POA: Diagnosis not present

## 2018-03-08 DIAGNOSIS — S82242A Displaced spiral fracture of shaft of left tibia, initial encounter for closed fracture: Secondary | ICD-10-CM | POA: Diagnosis not present

## 2018-03-09 DIAGNOSIS — Z4889 Encounter for other specified surgical aftercare: Secondary | ICD-10-CM | POA: Diagnosis not present

## 2018-03-09 DIAGNOSIS — T402X5A Adverse effect of other opioids, initial encounter: Secondary | ICD-10-CM | POA: Diagnosis present

## 2018-03-09 DIAGNOSIS — D649 Anemia, unspecified: Secondary | ICD-10-CM | POA: Diagnosis not present

## 2018-03-09 DIAGNOSIS — S82832A Other fracture of upper and lower end of left fibula, initial encounter for closed fracture: Secondary | ICD-10-CM | POA: Diagnosis not present

## 2018-03-09 DIAGNOSIS — S82452A Displaced comminuted fracture of shaft of left fibula, initial encounter for closed fracture: Secondary | ICD-10-CM | POA: Diagnosis not present

## 2018-03-09 DIAGNOSIS — S82252A Displaced comminuted fracture of shaft of left tibia, initial encounter for closed fracture: Secondary | ICD-10-CM | POA: Diagnosis not present

## 2018-03-09 DIAGNOSIS — M79662 Pain in left lower leg: Secondary | ICD-10-CM | POA: Diagnosis not present

## 2018-03-09 DIAGNOSIS — Z79899 Other long term (current) drug therapy: Secondary | ICD-10-CM | POA: Diagnosis not present

## 2018-03-09 DIAGNOSIS — Z7952 Long term (current) use of systemic steroids: Secondary | ICD-10-CM | POA: Diagnosis not present

## 2018-03-09 DIAGNOSIS — R270 Ataxia, unspecified: Secondary | ICD-10-CM | POA: Diagnosis not present

## 2018-03-09 DIAGNOSIS — S82242A Displaced spiral fracture of shaft of left tibia, initial encounter for closed fracture: Secondary | ICD-10-CM | POA: Diagnosis not present

## 2018-03-09 DIAGNOSIS — Z01810 Encounter for preprocedural cardiovascular examination: Secondary | ICD-10-CM | POA: Diagnosis not present

## 2018-03-09 DIAGNOSIS — S8292XS Unspecified fracture of left lower leg, sequela: Secondary | ICD-10-CM | POA: Diagnosis not present

## 2018-03-09 DIAGNOSIS — S82422A Displaced transverse fracture of shaft of left fibula, initial encounter for closed fracture: Secondary | ICD-10-CM | POA: Diagnosis not present

## 2018-03-09 DIAGNOSIS — M8000XA Age-related osteoporosis with current pathological fracture, unspecified site, initial encounter for fracture: Secondary | ICD-10-CM | POA: Diagnosis not present

## 2018-03-09 DIAGNOSIS — R1310 Dysphagia, unspecified: Secondary | ICD-10-CM | POA: Diagnosis present

## 2018-03-09 DIAGNOSIS — S82245A Nondisplaced spiral fracture of shaft of left tibia, initial encounter for closed fracture: Secondary | ICD-10-CM | POA: Diagnosis not present

## 2018-03-09 DIAGNOSIS — S82402A Unspecified fracture of shaft of left fibula, initial encounter for closed fracture: Secondary | ICD-10-CM | POA: Diagnosis not present

## 2018-03-09 DIAGNOSIS — D62 Acute posthemorrhagic anemia: Secondary | ICD-10-CM | POA: Diagnosis not present

## 2018-03-09 DIAGNOSIS — T403X5A Adverse effect of methadone, initial encounter: Secondary | ICD-10-CM | POA: Diagnosis present

## 2018-03-09 DIAGNOSIS — F418 Other specified anxiety disorders: Secondary | ICD-10-CM | POA: Diagnosis present

## 2018-03-09 DIAGNOSIS — M545 Low back pain: Secondary | ICD-10-CM | POA: Diagnosis present

## 2018-03-09 DIAGNOSIS — Z9989 Dependence on other enabling machines and devices: Secondary | ICD-10-CM | POA: Diagnosis not present

## 2018-03-09 DIAGNOSIS — F329 Major depressive disorder, single episode, unspecified: Secondary | ICD-10-CM | POA: Diagnosis not present

## 2018-03-09 DIAGNOSIS — J441 Chronic obstructive pulmonary disease with (acute) exacerbation: Secondary | ICD-10-CM | POA: Diagnosis not present

## 2018-03-09 DIAGNOSIS — Z87891 Personal history of nicotine dependence: Secondary | ICD-10-CM | POA: Diagnosis not present

## 2018-03-09 DIAGNOSIS — F17210 Nicotine dependence, cigarettes, uncomplicated: Secondary | ICD-10-CM | POA: Diagnosis not present

## 2018-03-09 DIAGNOSIS — M4850XA Collapsed vertebra, not elsewhere classified, site unspecified, initial encounter for fracture: Secondary | ICD-10-CM | POA: Diagnosis not present

## 2018-03-09 DIAGNOSIS — Z8249 Family history of ischemic heart disease and other diseases of the circulatory system: Secondary | ICD-10-CM | POA: Diagnosis not present

## 2018-03-09 DIAGNOSIS — Z66 Do not resuscitate: Secondary | ICD-10-CM | POA: Diagnosis present

## 2018-03-09 DIAGNOSIS — Z833 Family history of diabetes mellitus: Secondary | ICD-10-CM | POA: Diagnosis not present

## 2018-03-09 DIAGNOSIS — S82302A Unspecified fracture of lower end of left tibia, initial encounter for closed fracture: Secondary | ICD-10-CM | POA: Diagnosis not present

## 2018-03-09 DIAGNOSIS — J449 Chronic obstructive pulmonary disease, unspecified: Secondary | ICD-10-CM | POA: Diagnosis not present

## 2018-03-09 DIAGNOSIS — M818 Other osteoporosis without current pathological fracture: Secondary | ICD-10-CM | POA: Diagnosis not present

## 2018-03-09 DIAGNOSIS — K219 Gastro-esophageal reflux disease without esophagitis: Secondary | ICD-10-CM | POA: Diagnosis not present

## 2018-03-09 DIAGNOSIS — G8929 Other chronic pain: Secondary | ICD-10-CM | POA: Diagnosis not present

## 2018-03-09 DIAGNOSIS — N186 End stage renal disease: Secondary | ICD-10-CM | POA: Diagnosis not present

## 2018-03-09 DIAGNOSIS — S82202D Unspecified fracture of shaft of left tibia, subsequent encounter for closed fracture with routine healing: Secondary | ICD-10-CM | POA: Diagnosis not present

## 2018-03-09 DIAGNOSIS — Z9981 Dependence on supplemental oxygen: Secondary | ICD-10-CM | POA: Diagnosis not present

## 2018-03-09 DIAGNOSIS — S82392A Other fracture of lower end of left tibia, initial encounter for closed fracture: Secondary | ICD-10-CM | POA: Diagnosis not present

## 2018-03-09 DIAGNOSIS — J439 Emphysema, unspecified: Secondary | ICD-10-CM | POA: Diagnosis present

## 2018-03-09 DIAGNOSIS — J9611 Chronic respiratory failure with hypoxia: Secondary | ICD-10-CM | POA: Diagnosis not present

## 2018-03-09 DIAGNOSIS — S82202A Unspecified fracture of shaft of left tibia, initial encounter for closed fracture: Secondary | ICD-10-CM | POA: Diagnosis not present

## 2018-03-09 DIAGNOSIS — Z515 Encounter for palliative care: Secondary | ICD-10-CM | POA: Diagnosis present

## 2018-03-09 DIAGNOSIS — J9610 Chronic respiratory failure, unspecified whether with hypoxia or hypercapnia: Secondary | ICD-10-CM | POA: Diagnosis not present

## 2018-03-09 DIAGNOSIS — I491 Atrial premature depolarization: Secondary | ICD-10-CM | POA: Diagnosis not present

## 2018-03-09 DIAGNOSIS — E162 Hypoglycemia, unspecified: Secondary | ICD-10-CM | POA: Diagnosis present

## 2018-03-09 DIAGNOSIS — M80062A Age-related osteoporosis with current pathological fracture, left lower leg, initial encounter for fracture: Secondary | ICD-10-CM | POA: Diagnosis present

## 2018-03-09 DIAGNOSIS — R400 Somnolence: Secondary | ICD-10-CM | POA: Diagnosis present

## 2018-03-09 DIAGNOSIS — S82402D Unspecified fracture of shaft of left fibula, subsequent encounter for closed fracture with routine healing: Secondary | ICD-10-CM | POA: Diagnosis not present

## 2018-03-09 DIAGNOSIS — G8911 Acute pain due to trauma: Secondary | ICD-10-CM | POA: Diagnosis not present

## 2018-03-09 DIAGNOSIS — J438 Other emphysema: Secondary | ICD-10-CM | POA: Diagnosis not present

## 2018-03-09 DIAGNOSIS — Z043 Encounter for examination and observation following other accident: Secondary | ICD-10-CM | POA: Diagnosis not present

## 2018-03-09 DIAGNOSIS — J9612 Chronic respiratory failure with hypercapnia: Secondary | ICD-10-CM | POA: Diagnosis present

## 2018-04-14 DIAGNOSIS — R1084 Generalized abdominal pain: Secondary | ICD-10-CM | POA: Diagnosis not present

## 2018-04-14 DIAGNOSIS — Z743 Need for continuous supervision: Secondary | ICD-10-CM | POA: Diagnosis not present

## 2018-04-15 DIAGNOSIS — M549 Dorsalgia, unspecified: Secondary | ICD-10-CM | POA: Diagnosis present

## 2018-04-15 DIAGNOSIS — A419 Sepsis, unspecified organism: Secondary | ICD-10-CM | POA: Diagnosis not present

## 2018-04-15 DIAGNOSIS — F17200 Nicotine dependence, unspecified, uncomplicated: Secondary | ICD-10-CM | POA: Diagnosis present

## 2018-04-15 DIAGNOSIS — K5730 Diverticulosis of large intestine without perforation or abscess without bleeding: Secondary | ICD-10-CM | POA: Diagnosis not present

## 2018-04-15 DIAGNOSIS — K922 Gastrointestinal hemorrhage, unspecified: Secondary | ICD-10-CM | POA: Diagnosis not present

## 2018-04-15 DIAGNOSIS — Z66 Do not resuscitate: Secondary | ICD-10-CM | POA: Diagnosis not present

## 2018-04-15 DIAGNOSIS — K56699 Other intestinal obstruction unspecified as to partial versus complete obstruction: Secondary | ICD-10-CM | POA: Diagnosis not present

## 2018-04-15 DIAGNOSIS — K559 Vascular disorder of intestine, unspecified: Secondary | ICD-10-CM | POA: Diagnosis not present

## 2018-04-15 DIAGNOSIS — K2970 Gastritis, unspecified, without bleeding: Secondary | ICD-10-CM | POA: Diagnosis present

## 2018-04-15 DIAGNOSIS — R1084 Generalized abdominal pain: Secondary | ICD-10-CM | POA: Diagnosis not present

## 2018-04-15 DIAGNOSIS — K5720 Diverticulitis of large intestine with perforation and abscess without bleeding: Secondary | ICD-10-CM | POA: Diagnosis not present

## 2018-04-15 DIAGNOSIS — K55012 Diffuse acute (reversible) ischemia of small intestine: Secondary | ICD-10-CM | POA: Diagnosis not present

## 2018-04-15 DIAGNOSIS — G8929 Other chronic pain: Secondary | ICD-10-CM | POA: Diagnosis present

## 2018-04-15 DIAGNOSIS — K920 Hematemesis: Secondary | ICD-10-CM | POA: Diagnosis not present

## 2018-04-15 DIAGNOSIS — Z515 Encounter for palliative care: Secondary | ICD-10-CM | POA: Diagnosis not present

## 2018-04-15 DIAGNOSIS — K219 Gastro-esophageal reflux disease without esophagitis: Secondary | ICD-10-CM | POA: Diagnosis present

## 2018-04-15 DIAGNOSIS — K631 Perforation of intestine (nontraumatic): Secondary | ICD-10-CM | POA: Diagnosis present

## 2018-04-15 DIAGNOSIS — R579 Shock, unspecified: Secondary | ICD-10-CM | POA: Diagnosis not present

## 2018-04-15 DIAGNOSIS — R100 Acute abdomen: Secondary | ICD-10-CM | POA: Diagnosis not present

## 2018-04-15 DIAGNOSIS — E876 Hypokalemia: Secondary | ICD-10-CM | POA: Diagnosis present

## 2018-04-15 DIAGNOSIS — Z9981 Dependence on supplemental oxygen: Secondary | ICD-10-CM | POA: Diagnosis not present

## 2018-04-15 DIAGNOSIS — E861 Hypovolemia: Secondary | ICD-10-CM | POA: Diagnosis present

## 2018-04-15 DIAGNOSIS — K2971 Gastritis, unspecified, with bleeding: Secondary | ICD-10-CM | POA: Diagnosis not present

## 2018-04-15 DIAGNOSIS — J449 Chronic obstructive pulmonary disease, unspecified: Secondary | ICD-10-CM | POA: Diagnosis present

## 2018-04-15 DIAGNOSIS — K55032 Diffuse acute (reversible) ischemia of large intestine: Secondary | ICD-10-CM | POA: Diagnosis not present

## 2018-04-15 DIAGNOSIS — K3580 Unspecified acute appendicitis: Secondary | ICD-10-CM | POA: Diagnosis not present

## 2018-04-15 DIAGNOSIS — M810 Age-related osteoporosis without current pathological fracture: Secondary | ICD-10-CM | POA: Diagnosis present

## 2018-04-15 DIAGNOSIS — K37 Unspecified appendicitis: Secondary | ICD-10-CM | POA: Diagnosis not present
